# Patient Record
Sex: Female | Born: 1945 | Race: Black or African American | Hispanic: No | Marital: Single | Smoking: Former smoker
Health system: Southern US, Community
[De-identification: ages and names within clinical notes are randomized; demographics above are authoritative.]

## PROBLEM LIST (undated history)

## (undated) DIAGNOSIS — F0390 Unspecified dementia without behavioral disturbance: Secondary | ICD-10-CM

## (undated) DIAGNOSIS — K529 Noninfective gastroenteritis and colitis, unspecified: Secondary | ICD-10-CM

## (undated) DIAGNOSIS — F329 Major depressive disorder, single episode, unspecified: Secondary | ICD-10-CM

## (undated) DIAGNOSIS — I10 Essential (primary) hypertension: Secondary | ICD-10-CM

## (undated) DIAGNOSIS — N390 Urinary tract infection, site not specified: Secondary | ICD-10-CM

## (undated) DIAGNOSIS — F32A Depression, unspecified: Secondary | ICD-10-CM

## (undated) DIAGNOSIS — J189 Pneumonia, unspecified organism: Secondary | ICD-10-CM

## (undated) HISTORY — PX: APPENDECTOMY: SHX54

## (undated) HISTORY — PX: TONSILLECTOMY: SUR1361

## (undated) HISTORY — PX: TOTAL HIP ARTHROPLASTY: SHX124

---

## 2001-08-28 ENCOUNTER — Ambulatory Visit (HOSPITAL_COMMUNITY): Admission: RE | Admit: 2001-08-28 | Discharge: 2001-08-28 | Payer: Self-pay | Admitting: Internal Medicine

## 2001-08-28 ENCOUNTER — Encounter: Payer: Self-pay | Admitting: Internal Medicine

## 2001-09-19 ENCOUNTER — Ambulatory Visit (HOSPITAL_COMMUNITY): Admission: RE | Admit: 2001-09-19 | Discharge: 2001-09-19 | Payer: Self-pay | Admitting: Orthopaedic Surgery

## 2001-09-19 ENCOUNTER — Encounter: Payer: Self-pay | Admitting: Orthopaedic Surgery

## 2001-10-13 ENCOUNTER — Encounter: Payer: Self-pay | Admitting: Orthopaedic Surgery

## 2001-10-17 ENCOUNTER — Inpatient Hospital Stay (HOSPITAL_COMMUNITY): Admission: RE | Admit: 2001-10-17 | Discharge: 2001-10-21 | Payer: Self-pay | Admitting: Orthopaedic Surgery

## 2001-10-17 ENCOUNTER — Encounter: Payer: Self-pay | Admitting: Orthopaedic Surgery

## 2002-01-21 ENCOUNTER — Encounter (HOSPITAL_COMMUNITY): Admission: RE | Admit: 2002-01-21 | Discharge: 2002-02-20 | Payer: Self-pay | Admitting: Orthopaedic Surgery

## 2002-10-16 ENCOUNTER — Encounter: Payer: Self-pay | Admitting: Urology

## 2002-10-16 ENCOUNTER — Ambulatory Visit (HOSPITAL_COMMUNITY): Admission: RE | Admit: 2002-10-16 | Discharge: 2002-10-16 | Payer: Self-pay | Admitting: Urology

## 2008-01-06 ENCOUNTER — Ambulatory Visit (HOSPITAL_COMMUNITY): Admission: RE | Admit: 2008-01-06 | Discharge: 2008-01-06 | Payer: Self-pay | Admitting: Family Medicine

## 2008-01-14 ENCOUNTER — Ambulatory Visit (HOSPITAL_COMMUNITY): Admission: RE | Admit: 2008-01-14 | Discharge: 2008-01-14 | Payer: Self-pay | Admitting: Family Medicine

## 2008-03-07 ENCOUNTER — Emergency Department (HOSPITAL_COMMUNITY): Admission: EM | Admit: 2008-03-07 | Discharge: 2008-03-07 | Payer: Self-pay | Admitting: Emergency Medicine

## 2009-11-16 ENCOUNTER — Emergency Department (HOSPITAL_COMMUNITY): Admission: EM | Admit: 2009-11-16 | Discharge: 2009-11-16 | Payer: Self-pay | Admitting: Emergency Medicine

## 2009-11-30 ENCOUNTER — Emergency Department (HOSPITAL_COMMUNITY): Admission: EM | Admit: 2009-11-30 | Discharge: 2009-11-30 | Payer: Self-pay | Admitting: Emergency Medicine

## 2010-01-16 ENCOUNTER — Emergency Department (HOSPITAL_COMMUNITY): Admission: EM | Admit: 2010-01-16 | Discharge: 2010-01-16 | Payer: Self-pay | Admitting: Emergency Medicine

## 2010-06-21 ENCOUNTER — Ambulatory Visit (HOSPITAL_COMMUNITY): Admission: RE | Admit: 2010-06-21 | Discharge: 2010-06-21 | Payer: Self-pay | Admitting: Internal Medicine

## 2011-03-23 ENCOUNTER — Emergency Department (HOSPITAL_COMMUNITY)
Admission: EM | Admit: 2011-03-23 | Discharge: 2011-03-23 | Disposition: A | Discharge status: Home / Self Care | Payer: Medicare Other | Attending: Emergency Medicine | Admitting: Emergency Medicine

## 2011-03-23 DIAGNOSIS — T783XXA Angioneurotic edema, initial encounter: Secondary | ICD-10-CM | POA: Insufficient documentation

## 2011-03-23 DIAGNOSIS — I10 Essential (primary) hypertension: Secondary | ICD-10-CM | POA: Insufficient documentation

## 2011-03-23 LAB — CBC
Hemoglobin: 12.6 g/dL (ref 12.0–15.0)
MCH: 29.3 pg (ref 26.0–34.0)
MCV: 86.7 fL (ref 78.0–100.0)
RBC: 4.3 MIL/uL (ref 3.87–5.11)
WBC: 12.5 10*3/uL — ABNORMAL HIGH (ref 4.0–10.5)

## 2011-03-23 LAB — BASIC METABOLIC PANEL
CO2: 24 mEq/L (ref 19–32)
Chloride: 108 mEq/L (ref 96–112)
Creatinine, Ser: 0.84 mg/dL (ref 0.4–1.2)
GFR calc Af Amer: >60 mL/min (ref >60)
Potassium: 3.6 mEq/L (ref 3.5–5.1)

## 2011-03-23 LAB — DIFFERENTIAL
Basophils Relative: 0 % (ref 0–1)
Lymphs Abs: 0.8 10*3/uL (ref 0.7–4.0)
Monocytes Relative: 4 % (ref 3–12)
Neutro Abs: 11.1 10*3/uL — ABNORMAL HIGH (ref 1.7–7.7)
Neutrophils Relative %: 89 % — ABNORMAL HIGH (ref 43–77)

## 2011-04-20 NOTE — Discharge Summary (Signed)
[facility]  Patient:    Amy Drake, Amy Drake Visit Number: [id number] MRN: [id number]          Service Type: SUR Location: 4W 0478 01 Attending Physician:  Patricia Nettle Dictated by:   Ralene Bathe, P.A.-C. Admit Date:  10/17/2001 Discharge Date: 10/21/2001                             Discharge Summary  ADMITTING DIAGNOSES: 1. End-stage osteoarthritis. 2. Avascular necrosis, right hip.  DISCHARGE DIAGNOSES: 1. End-stage osteoarthritis. 2. Avascular necrosis, right hip. 3. Status post right total hip arthroplasty. 4. Postoperative hemorrhagic anemia requiring transfusion without sequela.  CONSULTS:  None.  OPERATION:  Right total hip arthroplasty.  SURGEON:  Patricia Nettle, M.D.  ASSISTANT:  Colleen P. Mahar, P.A.-C.  ANESTHESIA:  General.  BRIEF HISTORY:  Amy Drake is a 65 year old female, who had avascular necrosis in the right hip with progressive and dysfunctional osteoarthritic changes.  She has progressed now to bone-on-bone deformity.  She is having significant pain, effecting her activities of daily living.  She wishes to proceed with total hip replacement arthroplasty at this time.  Risks and benefits discussed with patient.  She is in agreement with procedure.  HOSPITAL COURSE:  The patient admitted and underwent above-mentioned procedure and tolerated this well.  All appropriate IV antibiotics and analgesics were utilized.  Postoperatively, she was placed total hip precautions, weightbearing as tolerated right lower extremity.  She had Hemovac that was discontinued after 72 hours.  She did have postoperative hemorrhagic anemia, was transfused two units without sequela.  She had good result.  She had minimal pain postoperatively and did extremely well with physical therapy.  By postoperative day four, 10/21/01, she was ambulating distances over 100 feet tolerating her total hip precautions.  She had had a bowel movement.  She  was afebrile.  Her dressing and incision were clean and dry.  At this time, she was stable orthopedically for discharge to home.  The patient was on Coumadin for DVT and PE prophylaxis, monitored by pharmacy.  This will be continued for four weeks.  LABORATORY DATA:  Protimes monitored by pharmacy.  Admission hemoglobin 10.5, postoperatively down to 8.3 after transfusion stable to 11.2.  Chemistry on admission within normal limits.  Urinalysis showed few bacteria on admission. No culture.  Four units transfused in the chart, two intraoperatively.  EKG showed normal sinus rhythm.  Radiology shows no acute cardiopulmonary on preop 10/13/01, and osteoarthritis aseptic necrosis on the right hip on 10/13/01.  CONDITION ON DISCHARGE:  Stable and improved.  DISCHARGE MEDICATIONS: 1. Vicodin #30, 1-2 every 4-6 hours p.r.n. pain, 1 refill. 2. Robaxin 500 mg 1 every 8 hours p.r.n. spasm, 1 refill. 3. Coumadin per pharmacy. 4. Resume home medications.  DISCHARGE PLANS:  The patient being discharged to home.  She is total hip precautions.  Weightbearing as tolerated right lower extremity.  May shower at this time.  Follow-up two weeks postoperatively, call for time.  Call for any problems.  DIET:  Resume regular diet. Dictated by:   Ralene Bathe, P.A.-C. Attending Physician:  Patricia Nettle. DD:  10/21/01 TD:  10/21/01 Job: [id number]

## 2011-04-20 NOTE — Op Note (Signed)
[facility]  Patient:    Amy Drake, Amy Drake Visit Number: [id number] MRN: [id number]          Service Type: SUR Location: 4W 0478 01 Attending Physician:  Patricia Nettle Proc. Date: 10/17/01 Admit Date:  10/17/2001                             Operative Report  DIAGNOSIS:  Osteoarthritis right hip.  PROCEDURE:  Right total hip arthroplasty, uncemented, using DePuy Corail stem and Pinnacle cup  SURGEON:  Patricia Nettle, M.D.  ASSISTANT:  Colleen P. Mahar, P.A.-C.  COMPLICATIONS:  None.  DESCRIPTION OF PROCEDURE:  The patient was properly identified in the holding area as Amy Drake and taken to the operating room.  She underwent  general endotracheal anesthesia without difficulty.  She was placed into a lateral decubitus position right side up.  Care was taken to pad all bony prominences. She was given prophylactic IV antibiotics.  The hip was prepped and draped in the usual sterile fashion.  A 10 cm incision was utilized, taking the standard posterolateral approach to the hip.  The skin incision was carried down to the fascia lata,  The fascia lata was incised in line with the incision.  The gluteus maximus muscle was split in line with its fibers.  A Charnley retractor was placed.  The trochanteric bursa was excised.  The short external rotators were identified.  The rotators were taken off of their insertion to the femur.  The quadratus femoris was preserved.  A capsulotomy was performed. The hip was dislocated.  There was a very large overhanging posterosuperior osteophyte.  This was resected with an osteotome.  The femoral head was removed using a saw.  The cut was made at 1.5 cm above the lesser trochanter. At this point, attention was directed to the acetabulum.  The acetabular labrum was resected.  Acetabular reamer starting with a 43 and increasing in 2 mm increments were utilized up to 51.  At this point, a trial acetabular cup was  placed, confirming a good rim fit.  There was copious bleeding bone.  A 52 mm Pinnacle cup was then impacted into position with  45 degrees of horizontality and 15 degrees of anteversion.  A 25 mm cancellous bone screw was placed through the superior hole in the acetabular cuff.  A trial +4, 10 degree offset acetabular liner was placed.  At this point, attention was directed to the femur.  Successfully larger broachers were up to a size 13.  A calcar reamer was used to prepare the neck surface.  A +1, 32 mm trial femoral head was placed, and the hip was reduced.  There was excellent stability with 80 degrees of internal rotation with the hip in 90 degrees of flexion.  At this point, the trial acetabular liner was removed.  The +4, 10 degree offset Marathon acetabular liner was then packed into position.  Attention was directed back to the femur.  The broach was removed.  A size 13 Corail HA-coated stem was then impacted into position.  A +1, 32 mm femoral head was then placed.  The hip was reduced and again found to be extremely stable.  the leg lengths were equal.  She was lengthened approximately 3 cm.  The wound was copiously irrigated.  The deep fascia was closed using #1 interrupted Vicryl suture.  A drain was placed.  The subcutaneous layer was closed using  2-0 interrupted Vicryl sutures.  Skin staples were used to close the skin.  A sterile dressing was applied.  A knee immobilizer was placed.  The patient was extubated without difficulty and transferred to the recovery room in stable condition. Attending Physician:  Patricia Nettle. DD:  10/19/01 TD:  10/19/01 Job: [id number]

## 2011-04-20 NOTE — H&P (Signed)
[facility]  Patient:    Amy Drake, Amy Drake Visit Number: [id number] MRN: [id number]          Service Type: Attending:  Patricia Nettle, M.D. Dictated by:   Sammuel Cooper. Mahar, P.A. Adm. Date:  10/17/01                           History and Physical  DATE OF BIRTH:  [birth year].  IDENTIFICATION:  She is a 65 year old female.  CHIEF COMPLAINT:  Right hip pain.  HISTORY OF PRESENT ILLNESS:  Patient has a long history of pain and problems with her right hip dating back to approximately 1996.  She did not have a specific traumatic event at that time; she had a gradual onset of hip pain and disability.  She denies any history of steroid use or lupus or alcoholism. Her hip pain has been progressively getting worse.  She does not use any assistive devices for ambulation but does walk with a limp.  On September 23rd, she developed right posterior thigh pain.  Her pain is increased with standing and walking, occasionally with a lot of activity.  Her toes on her right foot will go numb.  She does not describe any significant weakness in the right lower extremity.  The pain has gotten to the point where it is constant in nature and it is severely affecting her activities of daily living and quality of life.  It was thought that her best course of management at this point would be to undergo a total hip replacement on the right side. Risks and benefits were discussed with the patient by Dr. Patricia Nettle and they decided to proceed.  ALLERGIES:  No known drug allergies.  MEDICATIONS:  Vicodin as needed for pain, Tylenol as needed for pain.  PAST MEDICAL HISTORY:  Patient is healthy.  PAST SURGICAL HISTORY:  Tonsillectomy in 1950, appendectomy in 1961 and ectopic pregnancy in 1982.  SOCIAL HISTORY:  She smokes one-half a pack of cigarettes per day.  She uses alcohol very rarely, only a few times per year.  She is single.  Her mother, who is 73 years old, lives  with her in her private residence in [place]. She does have an aunt and sister who will be available to help her after surgery.  She would like to go home with home health physical therapy rather than inpatient rehab, if that is a possibility for her.  FAMILY MEDICAL HISTORY:  Mother is alive at age 46 with diabetes.  Father is deceased at age 97 with hypertension.  REVIEW OF SYSTEMS:  Patient denies any fevers, chills, night sweats or bleeding tendencies.  CNS:  Denies blurred vision, double vision, headaches, seizure or paralysis.  PULMONARY:  Denies shortness of breath, productive cough or hemoptysis.  CARDIOVASCULAR:  Denies chest pain, angina, orthopnea or claudication.  GI:  Denies nausea, vomiting, constipation, diarrhea, melena or bloody stool.  GU:  Denies dysuria, hematuria or discharge.  MUSCULOSKELETAL: As per HPI.  PHYSICAL EXAMINATION:  VITAL SIGNS:  Blood pressure is 128/80.  Respirations are 16 and unlabored. Pulse is 76 and regular.  GENERAL APPEARANCE:  Patient is a 65 year old black female.  She is alert and oriented.  She is in no acute distress.  She is well-nourished, well-groomed. She is pleasant and cooperative to examination.  She appears her stated age.  HEENT:  Head is normocephalic, atraumatic.  Pupils are equal, round and reactive.  Nares are patent bilaterally.  Pharynx is clear.  No erythema or exudate.  NECK:  Soft and supple to palpation.  No lymphadenopathy noted.  No bruits. No thyromegaly.  CHEST:  Clear to auscultation bilaterally.  No rales, rhonchi, stridor, wheezes or friction rub.  BREASTS:  Not pertinent and not performed.  HEART:  S1 and S2.  Regular rate and rhythm.  No murmurs, gallops or rubs appreciated.  ABDOMEN:  Soft and supple to palpation.  Positive bowel sounds throughout. Nontender, nondistended.  No organomegaly noted.  GU:  Not pertinent and not performed.  EXTREMITIES:  There is a 2-cm leg length discrepancy,  with the right leg being shorter than the left.  Range of motion is decreased to the right hip.  She does have groin pain and posterior thigh pain.  Strength is 5/5 throughout bilateral lower extremities.  Negative straight leg raise bilaterally. Hypoactive reflexes.  Sensation is intact to light touch.  SKIN:  Intact without any lesions or rashes.  X-RAY FINDINGS:  X-ray shows a collapse of the right femoral head with end-stage arthritic changes, cysts and bone-on-bone contact.  IMPRESSION:  Avascular necrosis to the right hip.  PLAN:  Plan is to admit to [facility] on Friday, October 17, 2001, for a right total hip arthroplasty to be done by Dr. Sharolyn Douglas.  Patients general practitioner is Dr. Bernerd Limbo. Destefano out of [place].  Patient has contacted him to forward his medical clearance to [place]; we have not received this yet, however, it will be forwarded to the hospital upon our receipt of it. Dictated by:   Sammuel Cooper. Mahar, P.A. Attending:  Patricia Nettle, M.D. DD:  10/07/01 TD:  10/08/01 Job: [phone] [id number]

## 2012-02-03 ENCOUNTER — Encounter (HOSPITAL_COMMUNITY): Payer: Self-pay | Admitting: *Deleted

## 2012-02-03 ENCOUNTER — Emergency Department (HOSPITAL_COMMUNITY)
Admission: EM | Admit: 2012-02-03 | Discharge: 2012-02-03 | Disposition: A | Discharge status: Other Acute Inpatient Hospital | Payer: Medicare Other | Attending: Emergency Medicine | Admitting: Emergency Medicine

## 2012-02-03 DIAGNOSIS — F3289 Other specified depressive episodes: Secondary | ICD-10-CM | POA: Insufficient documentation

## 2012-02-03 DIAGNOSIS — X58XXXA Exposure to other specified factors, initial encounter: Secondary | ICD-10-CM | POA: Insufficient documentation

## 2012-02-03 DIAGNOSIS — F068 Other specified mental disorders due to known physiological condition: Secondary | ICD-10-CM | POA: Insufficient documentation

## 2012-02-03 DIAGNOSIS — F329 Major depressive disorder, single episode, unspecified: Secondary | ICD-10-CM | POA: Insufficient documentation

## 2012-02-03 DIAGNOSIS — R220 Localized swelling, mass and lump, head: Secondary | ICD-10-CM | POA: Insufficient documentation

## 2012-02-03 DIAGNOSIS — Z79899 Other long term (current) drug therapy: Secondary | ICD-10-CM | POA: Insufficient documentation

## 2012-02-03 DIAGNOSIS — T783XXA Angioneurotic edema, initial encounter: Secondary | ICD-10-CM | POA: Insufficient documentation

## 2012-02-03 DIAGNOSIS — R51 Headache: Secondary | ICD-10-CM | POA: Insufficient documentation

## 2012-02-03 HISTORY — DX: Major depressive disorder, single episode, unspecified: F32.9

## 2012-02-03 HISTORY — DX: Unspecified dementia, unspecified severity, without behavioral disturbance, psychotic disturbance, mood disturbance, and anxiety: F03.90

## 2012-02-03 HISTORY — DX: Depression, unspecified: F32.A

## 2012-02-03 MED ORDER — DIPHENHYDRAMINE HCL 50 MG/ML IJ SOLN
25.0000 mg | Freq: Once | INTRAMUSCULAR | Status: AC
  Administered 2012-02-03: 13:00:00 via INTRAVENOUS
  Filled 2012-02-03: qty 1

## 2012-02-03 MED ORDER — METHYLPREDNISOLONE SODIUM SUCC 125 MG IJ SOLR
125.0000 mg | Freq: Once | INTRAMUSCULAR | Status: AC
  Administered 2012-02-03: 125 mg via INTRAVENOUS
  Filled 2012-02-03: qty 2

## 2012-02-03 MED ORDER — FAMOTIDINE IN NACL 20-0.9 MG/50ML-% IV SOLN
20.0000 mg | Freq: Once | INTRAVENOUS | Status: AC
  Administered 2012-02-03: 20 mg via INTRAVENOUS
  Filled 2012-02-03: qty 50

## 2012-02-03 NOTE — ED Notes (Signed)
Pt from [facility] called out for sick call, pt with lip swelling, no distress noted and pt very alert and talking, denies SOB

## 2012-02-03 NOTE — ED Notes (Signed)
Pt with bottom lip swelling, denies any pain or SOB, alert and talking, pt with dementia and not able to state correct month or year, not able to state the President of [place] either

## 2012-02-03 NOTE — Discharge Instructions (Signed)
STOP THE MEDICINE LISINOPRIL. CONTACT HER DOCTOR TOMORROW FOR A DIFFERENT BLOOD PRESSURE MEDICINE.    Angioedema Angioedema (AE) is sudden puffiness (swelling) of the skin. It can happen to the face, genitals, and other body parts. You may be reacting to something you are sensitive to (allergic reaction). It may have been passed to you from your parents (hereditary), or it may develop by itself (acquired). You may also get red itchy patches of skin (hives). Attacks may be mild. Some attacks are life-threatening. Most often, the puffiness happens fast. It often gets better in 24 to 48 hours.  HOME CARE  Carry your emergency allergy medicines with you.   Wear a medical bracelet.   Avoid things that you know will cause this reaction (triggers).  GET HELP RIGHT AWAY IF:   You have trouble breathing.   You have trouble swallowing.   You pass out (faint).   You have another attack.   Your attacks happen more often or get worse.   AE was passed to you by your parents and you want to start having children.  MAKE SURE YOU:   Understand these instructions.   Will watch your condition.   Will get help right away if you are not doing well or get worse.  Document Released: 11/07/2009 Document Revised: 11/08/2011 Document Reviewed: 11/07/2009 [facility] Patient Information 2012 [place], [place].

## 2012-02-03 NOTE — ED Notes (Signed)
MD at bedside. 

## 2012-02-03 NOTE — ED Provider Notes (Signed)
History   This chart was scribed for [employer]. Colon Branch, MD by Clarita Crane. The patient was seen in room APA14/APA14. Patient's care was started at 1113.    CSN: [id number]  Arrival date & time 02/03/12  1113   First MD Initiated Contact with Patient 02/03/12 1218      Chief Complaint  Patient presents with  . Allergic Reaction    (Consider location/radiation/quality/duration/timing/severity/associated sxs/prior treatment) HPI Amy Drake is a 66 y.o. female who presents to the Emergency Department BIB EMS from [facility] facility complaining of constant moderate swelling diffusely to lower lip with red lesion to left of nose onset this morning and persistent since. Denies dysphagia, pruritus, rash, SOB. Patient is currently on Lisinopril. Patient with h/o Dementia, depression  PCP- Margo Aye  Past Medical History  Diagnosis Date  . Dementia   . Depression     Past Surgical History  Procedure Date  . Appendectomy     History reviewed. No pertinent family history.  History  Substance Use Topics  . Smoking status: Current Everyday Smoker    Types: Cigarettes  . Smokeless tobacco: Not on file  . Alcohol Use:   -Resident of [place]  OB History    Grav Para Term Preterm Abortions TAB SAB Ect Mult Living                  Review of Systems 10 Systems reviewed and are negative for acute change except as noted in the HPI.  Allergies  Review of patient's allergies indicates no known allergies.  Home Medications   Current Outpatient Rx  Name Route Sig Dispense Refill  . LISINOPRIL 20 MG PO TABS Oral Take 20 mg by mouth daily.      BP 130/78  Pulse 84  Temp(Src) 98.3 F (36.8 C) (Oral)  Resp 20  Ht 5\' 4"  (1.626 m)  Wt 145 lb (65.772 kg)  BMI 24.89 kg/m2  SpO2 100%  Physical Exam  Nursing note and vitals reviewed. Constitutional: She is oriented to person, place, and time. She appears well-developed and well-nourished. No distress.  HENT:  Head:  Normocephalic and atraumatic.       10mm round lesion to left side of face c/w cryotherapy.  Lower lip swelling noted.   Eyes: EOM are normal. Pupils are equal, round, and reactive to light.  Neck: Neck supple. No tracheal deviation present.  Cardiovascular: Normal rate and regular rhythm.  Exam reveals no gallop and no friction rub.   No murmur heard. Pulmonary/Chest: Effort normal. No respiratory distress. She has no wheezes. She has no rales.  Abdominal: Soft. She exhibits no distension.  Musculoskeletal: Normal range of motion. She exhibits no edema.  Neurological: She is alert and oriented to person, place, and time. No sensory deficit.  Skin: Skin is warm and dry.  Psychiatric: She has a normal mood and affect. Her behavior is normal.    ED Course  Procedures (including critical care time)  DIAGNOSTIC STUDIES: Oxygen Saturation is 10% on room air, normal by my interpretation.    COORDINATION OF CARE: 12:32PM- Patient informed of current plan for treatment and evaluation and agrees with plan at this time.  2:20PM- Patients lip swelling not improved at this time but is no worse either.    Labs Reviewed - No data to display No results found.   No diagnosis found.    MDM  Nursing home patient here with c/o of swelling to her lower lip. Patient is on ACE  inhibitor and swelling is consistent with angioedema. Given solumedrol, pepcid and benadryl. Observed in the ER. She has had PO fluids and a snack without difficulty. No further swelling noted. Nursing home contact and advised of diagnosis and necessary follow up . Marland KitchenPt stable in ED with no significant deterioration in condition.The patient appears reasonably screened and/or stabilized for discharge and I doubt any other medical condition or other [facility] requiring further screening, evaluation, or treatment in the ED at this time prior to discharge.  I personally performed the services described in this documentation, which was  scribed in my presence. The recorded information has been reviewed and considered.  MDM Reviewed: nursing note and vitals        Nicoletta Dress. Colon Branch, MD 02/03/12 1441

## 2012-02-15 ENCOUNTER — Emergency Department (HOSPITAL_COMMUNITY): Payer: Medicare Other

## 2012-02-15 ENCOUNTER — Inpatient Hospital Stay (HOSPITAL_COMMUNITY)
Admission: EM | Admit: 2012-02-15 | Discharge: 2012-02-18 | DRG: 391 | Disposition: A | Discharge status: Skilled Nursing Facility | Payer: Medicare Other | Attending: Internal Medicine | Admitting: Internal Medicine

## 2012-02-15 ENCOUNTER — Encounter (HOSPITAL_COMMUNITY): Payer: Self-pay | Admitting: *Deleted

## 2012-02-15 DIAGNOSIS — F329 Major depressive disorder, single episode, unspecified: Secondary | ICD-10-CM | POA: Diagnosis present

## 2012-02-15 DIAGNOSIS — J189 Pneumonia, unspecified organism: Secondary | ICD-10-CM | POA: Diagnosis present

## 2012-02-15 DIAGNOSIS — K529 Noninfective gastroenteritis and colitis, unspecified: Secondary | ICD-10-CM

## 2012-02-15 DIAGNOSIS — F0390 Unspecified dementia without behavioral disturbance: Secondary | ICD-10-CM | POA: Diagnosis present

## 2012-02-15 DIAGNOSIS — I10 Essential (primary) hypertension: Secondary | ICD-10-CM | POA: Diagnosis present

## 2012-02-15 DIAGNOSIS — N390 Urinary tract infection, site not specified: Secondary | ICD-10-CM | POA: Diagnosis present

## 2012-02-15 DIAGNOSIS — A088 Other specified intestinal infections: Principal | ICD-10-CM | POA: Diagnosis present

## 2012-02-15 DIAGNOSIS — E860 Dehydration: Secondary | ICD-10-CM | POA: Diagnosis present

## 2012-02-15 DIAGNOSIS — F3289 Other specified depressive episodes: Secondary | ICD-10-CM | POA: Diagnosis present

## 2012-02-15 DIAGNOSIS — Z79899 Other long term (current) drug therapy: Secondary | ICD-10-CM

## 2012-02-15 HISTORY — DX: Essential (primary) hypertension: I10

## 2012-02-15 LAB — CBC
HCT: 38.1 % (ref 36.0–46.0)
Hemoglobin: 12.5 g/dL (ref 12.0–15.0)
MCH: 28.6 pg (ref 26.0–34.0)
MCV: 87.2 fL (ref 78.0–100.0)
Platelets: 215 10*3/uL (ref 150–400)
RBC: 4.37 MIL/uL (ref 3.87–5.11)
WBC: 12.6 10*3/uL — ABNORMAL HIGH (ref 4.0–10.5)

## 2012-02-15 LAB — URINALYSIS, ROUTINE W REFLEX MICROSCOPIC
Glucose, UA: NEGATIVE mg/dL
Leukocytes, UA: NEGATIVE
pH: 5.5 (ref 5.0–8.0)

## 2012-02-15 LAB — COMPREHENSIVE METABOLIC PANEL
AST: 13 U/L (ref 0–37)
CO2: 26 mEq/L (ref 19–32)
Calcium: 9 mg/dL (ref 8.4–10.5)
Chloride: 98 mEq/L (ref 96–112)
Creatinine, Ser: 0.97 mg/dL (ref 0.50–1.10)
GFR calc Af Amer: 68 mL/min — ABNORMAL LOW (ref >90)
GFR calc non Af Amer: 59 mL/min — ABNORMAL LOW (ref >90)
Glucose, Bld: 155 mg/dL — ABNORMAL HIGH (ref 70–99)
Total Bilirubin: 0.5 mg/dL (ref 0.3–1.2)

## 2012-02-15 LAB — DIFFERENTIAL
Basophils Absolute: 0 10*3/uL (ref 0.0–0.1)
Basophils Relative: 0 % (ref 0–1)
Eosinophils Relative: 0 % (ref 0–5)
Lymphocytes Relative: 2 % — ABNORMAL LOW (ref 12–46)
Monocytes Absolute: 0.1 10*3/uL (ref 0.1–1.0)
Neutro Abs: 12.3 10*3/uL — ABNORMAL HIGH (ref 1.7–7.7)

## 2012-02-15 LAB — LACTIC ACID, PLASMA: Lactic Acid, Venous: 1.9 mmol/L (ref 0.5–2.2)

## 2012-02-15 MED ORDER — LEVOFLOXACIN IN D5W 750 MG/150ML IV SOLN
750.0000 mg | Freq: Once | INTRAVENOUS | Status: AC
  Administered 2012-02-15: 750 mg via INTRAVENOUS
  Filled 2012-02-15: qty 150

## 2012-02-15 MED ORDER — SODIUM CHLORIDE 0.9 % IV SOLN
INTRAVENOUS | Status: DC
  Administered 2012-02-15: 21:00:00 via INTRAVENOUS

## 2012-02-15 MED ORDER — SODIUM CHLORIDE 0.9 % IV BOLUS (SEPSIS)
500.0000 mL | Freq: Once | INTRAVENOUS | Status: AC
  Administered 2012-02-15: 500 mL via INTRAVENOUS

## 2012-02-15 MED ORDER — DEXTROSE 5 % IV SOLN
INTRAVENOUS | Status: AC
  Filled 2012-02-15: qty 1

## 2012-02-15 MED ORDER — VANCOMYCIN HCL IN DEXTROSE 1-5 GM/200ML-% IV SOLN
1000.0000 mg | Freq: Once | INTRAVENOUS | Status: AC
  Administered 2012-02-16: 1000 mg via INTRAVENOUS
  Filled 2012-02-15: qty 200

## 2012-02-15 MED ORDER — ACETAMINOPHEN 325 MG PO TABS
650.0000 mg | ORAL_TABLET | Freq: Once | ORAL | Status: AC
  Administered 2012-02-15: 650 mg via ORAL
  Filled 2012-02-15: qty 2

## 2012-02-15 MED ORDER — DEXTROSE 5 % IV SOLN
1.0000 g | Freq: Once | INTRAVENOUS | Status: AC
  Administered 2012-02-15: 1 g via INTRAVENOUS
  Filled 2012-02-15: qty 1

## 2012-02-15 MED ORDER — ACETAMINOPHEN 325 MG PO TABS
325.0000 mg | ORAL_TABLET | Freq: Once | ORAL | Status: AC
  Administered 2012-02-15: 325 mg via ORAL
  Filled 2012-02-15: qty 1

## 2012-02-15 NOTE — ED Notes (Signed)
Pt resting calmly w/ eyes closed. Rise & fall of the chest noted. Bed in low position, side rails up x2. NAD noted at this time.  

## 2012-02-15 NOTE — ED Notes (Signed)
Patient walked to the restroom and back to room with no assistance. 

## 2012-02-15 NOTE — H&P (Addendum)
Amy Drake is an 66 y.o. female.    PCP: [name], MD, MD   Chief Complaint: Nausea, vomiting, and diarrhea  HPI: This is a 66 year old, African American female, who lives in a skilled nursing facility, who was sent over here for nausea, vomiting, and diarrhea. It appears, patient has advanced dementia and is unable to provide any history whatsoever. She is very confused. Patient was also found to have fever.   Home Medications: Prior to Admission medications   Medication Sig Start Date End Date Taking? Authorizing Provider  amLODipine (NORVASC) 2.5 MG tablet Take 2.5 mg by mouth daily.   Yes Historical Provider, MD    Allergies: No Known Allergies  Past Medical History: Past Medical History  Diagnosis Date  . Dementia   . Depression   . Hypertension     Past Surgical History  Procedure Date  . Appendectomy   . Tonsillectomy   . Total hip arthroplasty     right    Social History: Unable to obtain due to dementia.  Family History: Unable to obtain due to her dementia.  Review of Systems -unable to obtain due to dementia  Physical Examination Blood pressure 92/55, pulse 91, temperature 98.9 F (37.2 C), temperature source Oral, resp. rate 20, height 5\' 4"  (1.626 m), weight 63.504 kg (140 lb), SpO2 98.00%.  General appearance: alert, distracted and no distress Head: Normocephalic, without obvious abnormality, atraumatic Eyes: conjunctivae/corneas clear. PERRL, EOM's intact.  Throat: lips, mucosa, and tongue normal; teeth and gums normal Neck: no adenopathy, no carotid bruit, no JVD, supple, symmetrical, trachea midline and thyroid not enlarged, symmetric, no tenderness/mass/nodules Resp: Decreased air entry at the bases Cardio: regular rate and rhythm, S1, S2 normal, no murmur, click, rub or gallop GI: soft, non-tender; bowel sounds normal; no masses,  no organomegaly Extremities: extremities normal, atraumatic, no cyanosis or edema Pulses: 2+ and  symmetric Skin: Skin color, texture, turgor normal. No rashes or lesions Lymph nodes: Cervical, supraclavicular, and axillary nodes normal. Neurologic: She is very confused, but moving all her extremities.  Laboratory Data: Results for orders placed during the hospital encounter of 02/15/12 (from the past 48 hour(s))  URINALYSIS, ROUTINE W REFLEX MICROSCOPIC     Status: Abnormal   Collection Time   02/15/12  8:20 PM      Component Value Range Comment   Color, Urine YELLOW  YELLOW     APPearance HAZY (*) CLEAR     Specific Gravity, Urine >1.030 (*) 1.005 - 1.030     pH 5.5  5.0 - 8.0     Glucose, UA NEGATIVE  NEGATIVE (mg/dL)    Hgb urine dipstick LARGE (*) NEGATIVE     Bilirubin Urine NEGATIVE  NEGATIVE     Ketones, ur NEGATIVE  NEGATIVE (mg/dL)    Protein, ur TRACE (*) NEGATIVE (mg/dL)    Urobilinogen, UA 0.2  0.0 - 1.0 (mg/dL)    Nitrite NEGATIVE  NEGATIVE     Leukocytes, UA NEGATIVE  NEGATIVE    URINE MICROSCOPIC-ADD ON     Status: Abnormal   Collection Time   02/15/12  8:20 PM      Component Value Range Comment   Squamous Epithelial / LPF RARE  RARE     WBC, UA 3-6  <3 (WBC/hpf)    RBC / HPF 7-10  <3 (RBC/hpf)    Bacteria, UA MANY (*) RARE    CBC     Status: Abnormal   Collection Time   02/15/12  8:59  PM      Component Value Range Comment   WBC 12.6 (*) 4.0 - 10.5 (K/uL)    RBC 4.37  3.87 - 5.11 (MIL/uL)    Hemoglobin 12.5  12.0 - 15.0 (g/dL)    HCT 16.1  09.6 - 04.5 (%)    MCV 87.2  78.0 - 100.0 (fL)    MCH 28.6  26.0 - 34.0 (pg)    MCHC 32.8  30.0 - 36.0 (g/dL)    RDW 40.9  81.1 - 91.4 (%)    Platelets 215  150 - 400 (K/uL)   COMPREHENSIVE METABOLIC PANEL     Status: Abnormal   Collection Time   02/15/12  8:59 PM      Component Value Range Comment   Sodium 132 (*) 135 - 145 (mEq/L)    Potassium 3.8  3.5 - 5.1 (mEq/L)    Chloride 98  96 - 112 (mEq/L)    CO2 26  19 - 32 (mEq/L)    Glucose, Bld 155 (*) 70 - 99 (mg/dL)    BUN 24 (*) 6 - 23 (mg/dL)    Creatinine,  Ser 7.82  0.50 - 1.10 (mg/dL)    Calcium 9.0  8.4 - 10.5 (mg/dL)    Total Protein 7.2  6.0 - 8.3 (g/dL)    Albumin 3.3 (*) 3.5 - 5.2 (g/dL)    AST 13  0 - 37 (U/L)    ALT 9  0 - 35 (U/L)    Alkaline Phosphatase 82  39 - 117 (U/L)    Total Bilirubin 0.5  0.3 - 1.2 (mg/dL)    GFR calc non Af Amer 59 (*) >90 (mL/min)    GFR calc Af Amer 68 (*) >90 (mL/min)   DIFFERENTIAL     Status: Abnormal   Collection Time   02/15/12  8:59 PM      Component Value Range Comment   Neutrophils Relative 97 (*) 43 - 77 (%)    Neutro Abs 12.3 (*) 1.7 - 7.7 (K/uL)    Lymphocytes Relative 2 (*) 12 - 46 (%)    Lymphs Abs 0.2 (*) 0.7 - 4.0 (K/uL)    Monocytes Relative 1 (*) 3 - 12 (%)    Monocytes Absolute 0.1  0.1 - 1.0 (K/uL)    Eosinophils Relative 0  0 - 5 (%)    Eosinophils Absolute 0.0  0.0 - 0.7 (K/uL)    Basophils Relative 0  0 - 1 (%)    Basophils Absolute 0.0  0.0 - 0.1 (K/uL)   LACTIC ACID, PLASMA     Status: Normal   Collection Time   02/15/12  8:59 PM      Component Value Range Comment   Lactic Acid, Venous 1.9  0.5 - 2.2 (mmol/L)   LIPASE, BLOOD     Status: Normal   Collection Time   02/15/12  8:59 PM      Component Value Range Comment   Lipase 51  11 - 59 (U/L)   PROCALCITONIN     Status: Normal   Collection Time   02/15/12  8:59 PM      Component Value Range Comment   Procalcitonin 0.67       Radiology Reports: Dg Chest 2 View  02/15/2012  *RADIOLOGY REPORT*  Clinical Data: Confusion; weakness and fever.  CHEST - 2 VIEW  Comparison: None.  Findings: The lungs are well-aerated.  Mild left basilar airspace opacity raises concern for mild pneumonia.  Chronically increased interstitial markings are  seen.  There is no evidence of pleural effusion or pneumothorax.  The heart is normal in size; the mediastinal contour is within normal limits.  No acute osseous abnormalities are seen.  IMPRESSION: Mild left basilar airspace opacity raises concern for mild pneumonia.  Original Report Authenticated  By: Tonia Ghent, M.D.    Assessment/Plan  Principal Problem:  *Acute gastroenteritis Active Problems:  HCAP (healthcare-associated pneumonia)  UTI (lower urinary tract infection)  Dementia  Dehydration   #1 acute gastroenteritis: She could have norovirus. She will be given symptomatic treatment. Contact precautions will be utilized. If she has recurrence of her fever, other testing may be considered.  #2 pneumonia is most likely healthcare associated versus aspiration. We'll utilize Zosyn and vancomycin for now. We'll also give her Levaquin. Swallow evaluation. Will be obtained.  #3 possible UTI: treat with antibiotics.  #4 dehydration: give her IV fluids. Monitor blood pressure closely and provide IV fluid boluses as needed.  #5 dementia: stable continue to monitor.  #6 history of hypertension. Due to borderline low blood pressures we will hold her amlodipine for now.  Patient is a full code. Based on paperwork provided to [place], it appears, that [employer] is her legal guardian.  DVT, prophylaxis be initiated.  Further management decisions will depend on results of further testing and patient's response to treatment.  [facility]  [facility] Pager [phone]  02/16/2012, 12:38 AM

## 2012-02-15 NOTE — ED Notes (Addendum)
Pt reports some diarrhea, denies vomiting. Pt w/ fever upon arrival. No active vomiting at this time.

## 2012-02-15 NOTE — ED Notes (Signed)
Pt from [facility]. Reported pt vomiting & diarrhea started 3 hours ago.

## 2012-02-15 NOTE — ED Provider Notes (Signed)
History     CSN: [id number]  Arrival date & time 02/15/12  1858   First MD Initiated Contact with Patient 02/15/12 1942      Chief Complaint  Patient presents with  . Diarrhea  . Emesis    Patient is a 66 y.o. female presenting with diarrhea and vomiting. The history is provided by the nursing home and the EMS personnel. History Limited By: hx dementia   Diarrhea  Emesis    Pt was seen at 2005.  Per EMS and NH report, c/o gradual onset and persistence of multiple intermittent episodes of N/V/D that began approx 3 hours PTA.  Pt with hx dementia, states she is "ok."  Denies CP, no abd pain.    Past Medical History  Diagnosis Date  . Dementia   . Depression   . Hypertension     Past Surgical History  Procedure Date  . Appendectomy   . Tonsillectomy   . Total hip arthroplasty     right    History  Substance Use Topics  . Smoking status: Current Everyday Smoker    Types: Cigarettes  . Smokeless tobacco: Not on file  . Alcohol Use: No    Review of Systems  Unable to perform ROS: Dementia    Allergies  Review of patient's allergies indicates no known allergies.  Home Medications   Current Outpatient Rx  Name Route Sig Dispense Refill  . AMLODIPINE BESYLATE 2.5 MG PO TABS Oral Take 2.5 mg by mouth daily.      BP 97/56  Pulse 96  Temp(Src) 100.5 F (38.1 C) (Oral)  Resp 20  Ht 5\' 4"  (1.626 m)  Wt 140 lb (63.504 kg)  BMI 24.03 kg/m2  SpO2 100%  Physical Exam 2010: Physical examination:  Nursing notes reviewed; Vital signs and O2 SAT reviewed;  Constitutional: Well developed, Well nourished, In no acute distress; Head:  Normocephalic, atraumatic; Eyes: EOMI, PERRL, No scleral icterus; ENMT: Mouth and pharynx normal, Mucous membranes dry; Neck: Supple, Full range of motion, No lymphadenopathy; Cardiovascular: Regular rate and rhythm, No murmur, rub, or gallop; Respiratory: Breath sounds clear & equal bilaterally, No rales, rhonchi, wheezes, or rub, Normal  respiratory effort/excursion; Chest: Nontender, Movement normal; Abdomen: Soft, Nontender, Nondistended, Normal bowel sounds; Genitourinary: No CVA tenderness; Extremities: Pulses normal, No tenderness, No edema, No calf edema or asymmetry.; Neuro: Awake, alert, confused re: time, place, events.  Speech clear, no facial droop. Major CN grossly intact.  Moves all ext on stretcher.; Skin: Color normal, Warm, Dry, no rash.    ED Course  Procedures   MDM  MDM Reviewed: nursing note and vitals Interpretation: labs and x-ray   Results for orders placed during the hospital encounter of 02/15/12  URINALYSIS, ROUTINE W REFLEX MICROSCOPIC      Component Value Range   Color, Urine YELLOW  YELLOW    APPearance HAZY (*) CLEAR    Specific Gravity, Urine >1.030 (*) 1.005 - 1.030    pH 5.5  5.0 - 8.0    Glucose, UA NEGATIVE  NEGATIVE (mg/dL)   Hgb urine dipstick LARGE (*) NEGATIVE    Bilirubin Urine NEGATIVE  NEGATIVE    Ketones, ur NEGATIVE  NEGATIVE (mg/dL)   Protein, ur TRACE (*) NEGATIVE (mg/dL)   Urobilinogen, UA 0.2  0.0 - 1.0 (mg/dL)   Nitrite NEGATIVE  NEGATIVE    Leukocytes, UA NEGATIVE  NEGATIVE   URINE MICROSCOPIC-ADD ON      Component Value Range   Squamous Epithelial / LPF  RARE  RARE    WBC, UA 3-6  <3 (WBC/hpf)   RBC / HPF 7-10  <3 (RBC/hpf)   Bacteria, UA MANY (*) RARE   CBC      Component Value Range   WBC 12.6 (*) 4.0 - 10.5 (K/uL)   RBC 4.37  3.87 - 5.11 (MIL/uL)   Hemoglobin 12.5  12.0 - 15.0 (g/dL)   HCT 78.2  95.6 - 21.3 (%)   MCV 87.2  78.0 - 100.0 (fL)   MCH 28.6  26.0 - 34.0 (pg)   MCHC 32.8  30.0 - 36.0 (g/dL)   RDW 08.6  57.8 - 46.9 (%)   Platelets 215  150 - 400 (K/uL)  COMPREHENSIVE METABOLIC PANEL      Component Value Range   Sodium 132 (*) 135 - 145 (mEq/L)   Potassium 3.8  3.5 - 5.1 (mEq/L)   Chloride 98  96 - 112 (mEq/L)   CO2 26  19 - 32 (mEq/L)   Glucose, Bld 155 (*) 70 - 99 (mg/dL)   BUN 24 (*) 6 - 23 (mg/dL)   Creatinine, Ser 6.29  0.50 - 1.10  (mg/dL)   Calcium 9.0  8.4 - 52.8 (mg/dL)   Total Protein 7.2  6.0 - 8.3 (g/dL)   Albumin 3.3 (*) 3.5 - 5.2 (g/dL)   AST 13  0 - 37 (U/L)   ALT 9  0 - 35 (U/L)   Alkaline Phosphatase 82  39 - 117 (U/L)   Total Bilirubin 0.5  0.3 - 1.2 (mg/dL)   GFR calc non Af Amer 59 (*) >90 (mL/min)   GFR calc Af Amer 68 (*) >90 (mL/min)  DIFFERENTIAL      Component Value Range   Neutrophils Relative 97 (*) 43 - 77 (%)   Neutro Abs 12.3 (*) 1.7 - 7.7 (K/uL)   Lymphocytes Relative 2 (*) 12 - 46 (%)   Lymphs Abs 0.2 (*) 0.7 - 4.0 (K/uL)   Monocytes Relative 1 (*) 3 - 12 (%)   Monocytes Absolute 0.1  0.1 - 1.0 (K/uL)   Eosinophils Relative 0  0 - 5 (%)   Eosinophils Absolute 0.0  0.0 - 0.7 (K/uL)   Basophils Relative 0  0 - 1 (%)   Basophils Absolute 0.0  0.0 - 0.1 (K/uL)  LACTIC ACID, PLASMA      Component Value Range   Lactic Acid, Venous 1.9  0.5 - 2.2 (mmol/L)  LIPASE, BLOOD      Component Value Range   Lipase 51  11 - 59 (U/L)  PROCALCITONIN      Component Value Range   Procalcitonin 0.67     Dg Chest 2 View 02/15/2012  *RADIOLOGY REPORT*  Clinical Data: Confusion; weakness and fever.  CHEST - 2 VIEW  Comparison: None.  Findings: The lungs are well-aerated.  Mild left basilar airspace opacity raises concern for mild pneumonia.  Chronically increased interstitial markings are seen.  There is no evidence of pleural effusion or pneumothorax.  The heart is normal in size; the mediastinal contour is within normal limits.  No acute osseous abnormalities are seen.  IMPRESSION: Mild left basilar airspace opacity raises concern for mild pneumonia.  Original Report Authenticated By: Tonia Ghent, M.D.    10:49 PM:  SBP dropped to low 90's from 120, will give IVF bolus.  Fever improved after APAP.  IV abx ordered for HCAP.  +UTI, UC pending.  T/C to [facility]. Rito Ehrlich, case discussed, including:  HPI, pertinent PM/SHx, VS/PE,  dx testing, ED course and treatment:  Agreeable to admit, requests to obtain  tele bed to team 2.          Laray Anger, DO 02/17/12 1506

## 2012-02-16 ENCOUNTER — Encounter (HOSPITAL_COMMUNITY): Payer: Self-pay | Admitting: Internal Medicine

## 2012-02-16 DIAGNOSIS — N390 Urinary tract infection, site not specified: Secondary | ICD-10-CM

## 2012-02-16 DIAGNOSIS — K529 Noninfective gastroenteritis and colitis, unspecified: Secondary | ICD-10-CM | POA: Diagnosis present

## 2012-02-16 DIAGNOSIS — J189 Pneumonia, unspecified organism: Secondary | ICD-10-CM | POA: Diagnosis present

## 2012-02-16 DIAGNOSIS — I10 Essential (primary) hypertension: Secondary | ICD-10-CM | POA: Diagnosis present

## 2012-02-16 DIAGNOSIS — E860 Dehydration: Secondary | ICD-10-CM | POA: Diagnosis present

## 2012-02-16 DIAGNOSIS — F0390 Unspecified dementia without behavioral disturbance: Secondary | ICD-10-CM | POA: Diagnosis present

## 2012-02-16 HISTORY — DX: Urinary tract infection, site not specified: N39.0

## 2012-02-16 HISTORY — DX: Pneumonia, unspecified organism: J18.9

## 2012-02-16 HISTORY — DX: Noninfective gastroenteritis and colitis, unspecified: K52.9

## 2012-02-16 LAB — CBC
MCH: 28.9 pg (ref 26.0–34.0)
MCHC: 32.7 g/dL (ref 30.0–36.0)
Platelets: 169 10*3/uL (ref 150–400)
RDW: 13.1 % (ref 11.5–15.5)

## 2012-02-16 LAB — COMPREHENSIVE METABOLIC PANEL
ALT: 8 U/L (ref 0–35)
AST: 15 U/L (ref 0–37)
CO2: 24 mEq/L (ref 19–32)
Chloride: 100 mEq/L (ref 96–112)
GFR calc non Af Amer: 56 mL/min — ABNORMAL LOW (ref >90)
Sodium: 131 mEq/L — ABNORMAL LOW (ref 135–145)
Total Bilirubin: 0.6 mg/dL (ref 0.3–1.2)

## 2012-02-16 LAB — MRSA PCR SCREENING: MRSA by PCR: NEGATIVE

## 2012-02-16 MED ORDER — LEVOFLOXACIN IN D5W 500 MG/100ML IV SOLN
INTRAVENOUS | Status: AC
  Filled 2012-02-16: qty 100

## 2012-02-16 MED ORDER — SODIUM CHLORIDE 0.9 % IV BOLUS (SEPSIS)
500.0000 mL | Freq: Once | INTRAVENOUS | Status: AC
  Administered 2012-02-16: 500 mL via INTRAVENOUS

## 2012-02-16 MED ORDER — SODIUM CHLORIDE 0.9 % IV SOLN
INTRAVENOUS | Status: DC
  Administered 2012-02-16 – 2012-02-17 (×3): via INTRAVENOUS

## 2012-02-16 MED ORDER — ACETAMINOPHEN 325 MG PO TABS: 650.0000 mg | ORAL_TABLET | Freq: Four times a day (QID) | ORAL | Status: DC | PRN

## 2012-02-16 MED ORDER — ALBUTEROL SULFATE (5 MG/ML) 0.5% IN NEBU: 2.5000 mg | INHALATION_SOLUTION | RESPIRATORY_TRACT | Status: DC | PRN

## 2012-02-16 MED ORDER — LEVOFLOXACIN IN D5W 500 MG/100ML IV SOLN
500.0000 mg | INTRAVENOUS | Status: DC
  Filled 2012-02-16 (×3): qty 100

## 2012-02-16 MED ORDER — ONDANSETRON HCL 4 MG/2ML IJ SOLN: 4.0000 mg | Freq: Four times a day (QID) | INTRAMUSCULAR | Status: DC | PRN

## 2012-02-16 MED ORDER — ACETAMINOPHEN 650 MG RE SUPP: 650.0000 mg | Freq: Four times a day (QID) | RECTAL | Status: DC | PRN

## 2012-02-16 MED ORDER — LEVOFLOXACIN IN D5W 750 MG/150ML IV SOLN
750.0000 mg | INTRAVENOUS | Status: DC
  Filled 2012-02-16: qty 150

## 2012-02-16 MED ORDER — VANCOMYCIN HCL 500 MG IV SOLR
500.0000 mg | Freq: Two times a day (BID) | INTRAVENOUS | Status: DC
  Filled 2012-02-16 (×5): qty 500

## 2012-02-16 MED ORDER — ONDANSETRON HCL 4 MG PO TABS: 4.0000 mg | ORAL_TABLET | Freq: Four times a day (QID) | ORAL | Status: DC | PRN

## 2012-02-16 MED ORDER — PIPERACILLIN-TAZOBACTAM 3.375 G IVPB
3.3750 g | Freq: Three times a day (TID) | INTRAVENOUS | Status: DC
  Administered 2012-02-16: 3.375 g via INTRAVENOUS
  Filled 2012-02-16 (×7): qty 50

## 2012-02-16 NOTE — Plan of Care (Signed)
Problem: Phase I Progression Outcomes Goal: Pain controlled with appropriate interventions Outcome: Completed/Met Date Met:  02/16/12 Pt is denying any pain.

## 2012-02-16 NOTE — Progress Notes (Signed)
Subjective: She denies any nausea or vomiting, denies any diarrhea  Objective: Vital signs in last 24 hours: Temp:  [98.9 F (37.2 C)-102.2 F (39 C)] 99.4 F (37.4 C) (03/16 0428) Pulse Rate:  [81-101] 81  (03/16 0428) Resp:  [20] 20  (03/16 0428) BP: (92-119)/(55-79) 110/73 mmHg (03/16 0428) SpO2:  [96 %-100 %] 99 % (03/16 0428) Weight:  [63.504 kg (140 lb)] 63.504 kg (140 lb) (03/15 1900) Weight change:  Last BM Date: 02/16/12  Intake/Output from previous day: 03/15 0701 - 03/16 0700 In: 900 [IV Piggyback:900] Out: -      Physical Exam: General: Alert, awake, in no acute distress. HEENT: No bruits, no goiter. Heart: Regular rate and rhythm, without murmurs, rubs, gallops. Lungs: Clear to auscultation bilaterally. Abdomen: Soft, nontender, nondistended, positive bowel sounds. Extremities: No clubbing cyanosis or edema with positive pedal pulses. Neuro: Grossly intact, nonfocal.    Lab Results: Basic Metabolic Panel:  Basename 02/16/12 0732 02/15/12 2059  NA 131* 132*  K 3.5 3.8  CL 100 98  CO2 24 26  GLUCOSE 122* 155*  BUN 19 24*  CREATININE 1.01 0.97  CALCIUM 8.7 9.0  MG -- --  PHOS -- --   Liver Function Tests:  Basename 02/16/12 0732 02/15/12 2059  AST 15 13  ALT 8 9  ALKPHOS 72 82  BILITOT 0.6 0.5  PROT 6.8 7.2  ALBUMIN 3.0* 3.3*    Basename 02/15/12 2059  LIPASE 51  AMYLASE --   No results found for this basename: AMMONIA:2 in the last 72 hours CBC:  Basename 02/16/12 0732 02/15/12 2059  WBC 7.5 12.6*  NEUTROABS -- 12.3*  HGB 11.8* 12.5  HCT 36.1 38.1  MCV 88.3 87.2  PLT 169 215   Cardiac Enzymes: No results found for this basename: CKTOTAL:3,CKMB:3,CKMBINDEX:3,TROPONINI:3 in the last 72 hours BNP: No results found for this basename: PROBNP:3 in the last 72 hours D-Dimer: No results found for this basename: DDIMER:2 in the last 72 hours CBG: No results found for this basename: GLUCAP:6 in the last 72 hours Hemoglobin A1C: No  results found for this basename: HGBA1C in the last 72 hours Fasting Lipid Panel: No results found for this basename: CHOL,HDL,LDLCALC,TRIG,CHOLHDL,LDLDIRECT in the last 72 hours Thyroid Function Tests: No results found for this basename: TSH,T4TOTAL,FREET4,T3FREE,THYROIDAB in the last 72 hours Anemia Panel: No results found for this basename: VITAMINB12,FOLATE,FERRITIN,TIBC,IRON,RETICCTPCT in the last 72 hours Coagulation: No results found for this basename: LABPROT:2,INR:2 in the last 72 hours Urine Drug Screen: Drugs of Abuse  No results found for this basename: labopia, cocainscrnur, labbenz, amphetmu, thcu, labbarb    Alcohol Level: No results found for this basename: ETH:2 in the last 72 hours Urinalysis:  Basename 02/15/12 2020  COLORURINE YELLOW  LABSPEC >1.030*  PHURINE 5.5  GLUCOSEU NEGATIVE  HGBUR LARGE*  BILIRUBINUR NEGATIVE  KETONESUR NEGATIVE  PROTEINUR TRACE*  UROBILINOGEN 0.2  NITRITE NEGATIVE  LEUKOCYTESUR NEGATIVE   Recent Results (from the past 240 hour(s))  MRSA PCR SCREENING     Status: Normal   Collection Time   02/16/12  5:30 AM      Component Value Range Status Comment   MRSA by PCR NEGATIVE  NEGATIVE  Final     Studies/Results: Dg Chest 2 View  02/15/2012  *RADIOLOGY REPORT*  Clinical Data: Confusion; weakness and fever.  CHEST - 2 VIEW  Comparison: None.  Findings: The lungs are well-aerated.  Mild left basilar airspace opacity raises concern for mild pneumonia.  Chronically increased interstitial markings are  seen.  There is no evidence of pleural effusion or pneumothorax.  The heart is normal in size; the mediastinal contour is within normal limits.  No acute osseous abnormalities are seen.  IMPRESSION: Mild left basilar airspace opacity raises concern for mild pneumonia.  Original Report Authenticated By: Tonia Ghent, M.D.    Medications: Scheduled Meds:   . acetaminophen  325 mg Oral Once  . acetaminophen  650 mg Oral Once  . ceFEPime  (MAXIPIME) IV  1 g Intravenous Once  . levofloxacin (LEVAQUIN) IV  750 mg Intravenous Once  . levofloxacin (LEVAQUIN) IV  750 mg Intravenous Q48H  . piperacillin-tazobactam (ZOSYN)  IV  3.375 g Intravenous Q8H  . sodium chloride  500 mL Intravenous Once  . sodium chloride  500 mL Intravenous Once  . vancomycin  500 mg Intravenous Q12H  . vancomycin  1,000 mg Intravenous Once  . DISCONTD: levofloxacin (LEVAQUIN) IV  500 mg Intravenous Q24H   Continuous Infusions:   . sodium chloride 100 mL/hr at 02/16/12 0419  . DISCONTD: sodium chloride 100 mL/hr at 02/15/12 2103   PRN Meds:.acetaminophen, acetaminophen, albuterol, ondansetron (ZOFRAN) IV, ondansetron  Assessment/Plan:  Principal Problem:  *Acute gastroenteritis Active Problems:  HCAP (healthcare-associated pneumonia)  UTI (lower urinary tract infection)  Dementia  Dehydration  HTN (hypertension)  Plan:  Gastroenteritis appears to be improving, will advance diet an continue supportive care She does not have any significant resp compromise and she has remained afebrile since admission last night.  We will de escalate her antibiotics to levaquin.  Follow up cultures. Volume status appears to be improved with IVF  Likely discharge back to NH tomorrow if she is tolerating her diet and has no recurrence of fever.   LOS: 1 day   Demira Gwynne [facility] Pager: [phone] 02/16/2012, 10:54 AM

## 2012-02-16 NOTE — ED Notes (Signed)
Marsha at [facility] notified of the pt being admitted to the hospital

## 2012-02-16 NOTE — Progress Notes (Signed)
ANTIBIOTIC CONSULT NOTE - INITIAL  Pharmacy Consult for Vancomycin and Zosyn  Indication: pneumonia HCAP  No Known Allergies  Patient Measurements: Height: 5\' 4"  (162.6 cm) Weight: 140 lb (63.504 kg) IBW/kg (Calculated) : 54.7   Vital Signs: Temp: 99.4 F (37.4 C) (03/16 0428) Temp src: Oral (03/16 0428) BP: 110/73 mmHg (03/16 0428) Pulse Rate: 81  (03/16 0428) Intake/Output from previous day: 03/15 0701 - 03/16 0700 In: 900 [IV Piggyback:900] Out: -  Intake/Output from this shift:    Labs:  Basename 02/16/12 0732 02/15/12 2059  WBC 7.5 12.6*  HGB 11.8* 12.5  PLT 169 215  LABCREA -- --  CREATININE -- 0.97   Estimated Creatinine Clearance: 47.9 ml/min (by C-G formula based on Cr of 0.97). No results found for this basename: VANCOTROUGH:2,VANCOPEAK:2,VANCORANDOM:2,GENTTROUGH:2,GENTPEAK:2,GENTRANDOM:2,TOBRATROUGH:2,TOBRAPEAK:2,TOBRARND:2,AMIKACINPEAK:2,AMIKACINTROU:2,AMIKACIN:2, in the last 72 hours   Microbiology: Recent Results (from the past 720 hour(s))  MRSA PCR SCREENING     Status: Normal   Collection Time   02/16/12  5:30 AM      Component Value Range Status Comment   MRSA by PCR NEGATIVE  NEGATIVE  Final     Medical History: Past Medical History  Diagnosis Date  . Dementia   . Depression   . Hypertension     Medications:  Scheduled:    . acetaminophen  325 mg Oral Once  . acetaminophen  650 mg Oral Once  . ceFEPime (MAXIPIME) IV  1 g Intravenous Once  . levofloxacin (LEVAQUIN) IV  750 mg Intravenous Once  . levofloxacin (LEVAQUIN) IV  750 mg Intravenous Q48H  . piperacillin-tazobactam (ZOSYN)  IV  3.375 g Intravenous Q8H  . sodium chloride  500 mL Intravenous Once  . sodium chloride  500 mL Intravenous Once  . vancomycin  500 mg Intravenous Q12H  . vancomycin  1,000 mg Intravenous Once  . DISCONTD: levofloxacin (LEVAQUIN) IV  500 mg Intravenous Q24H   Assessment: Okay for Protocol Estimated Creatinine Clearance: 47.9 ml/min (by C-G formula  based on Cr of 0.97).  Goal of Therapy:  Vancomycin trough level 15-20 mcg/ml  Plan:  Measure antibiotic drug levels at steady state Follow up culture results Vancomycin 500mg  IV every 12 hours. Zosyn 3.375gm IV every 8 hours. Follow-up micro data, labs, vitals. Adjust Levaquin to 750mg  IV every 48 hours for renal function.  Lamonte Richer R 02/16/2012,8:28 AM

## 2012-02-17 LAB — URINE CULTURE
Colony Count: NO GROWTH
Culture  Setup Time: 201303162006
Culture: NO GROWTH

## 2012-02-17 LAB — BASIC METABOLIC PANEL
CO2: 21 mEq/L (ref 19–32)
Calcium: 8.3 mg/dL — ABNORMAL LOW (ref 8.4–10.5)
Creatinine, Ser: 0.96 mg/dL (ref 0.50–1.10)
GFR calc Af Amer: 69 mL/min — ABNORMAL LOW (ref >90)

## 2012-02-17 MED ORDER — LOPERAMIDE HCL 2 MG PO TABS: 2.0000 mg | ORAL_TABLET | Freq: Four times a day (QID) | ORAL | Status: AC | PRN

## 2012-02-17 MED ORDER — LEVOFLOXACIN 750 MG PO TABS: 750.0000 mg | ORAL_TABLET | Freq: Every day | ORAL | Status: AC

## 2012-02-17 MED ORDER — ONDANSETRON HCL 4 MG PO TABS: 4.0000 mg | ORAL_TABLET | Freq: Three times a day (TID) | ORAL | Status: AC | PRN

## 2012-02-17 NOTE — Progress Notes (Signed)
Spoke with [facility].  They cannot take pt back today because they have no way of getting her new rxs filled.  Will alert unit CSW for d/c tomorrow.

## 2012-02-17 NOTE — Plan of Care (Signed)
Problem: Phase II Progression Outcomes Goal: Progress activity as tolerated unless otherwise ordered Outcome: Progressing Pt ambulates with assistance to the bathroom.

## 2012-02-17 NOTE — Discharge Summary (Signed)
Physician Discharge Summary  Patient ID: Amy Drake MRN: [id number] DOB/AGE: 02/11/1946 66 y.o.  Admit date: 02/15/2012 Discharge date: 02/17/2012  Primary Care Physician:  [name], MD, MD   Discharge Diagnoses:    Principal Problem:  *Acute gastroenteritis Active Problems:  HCAP (healthcare-associated pneumonia)  UTI (lower urinary tract infection)  Dementia  Dehydration  HTN (hypertension)    Medication List  As of 02/17/2012 11:39 AM   STOP taking these medications         amLODipine 2.5 MG tablet         TAKE these medications         levofloxacin 750 MG tablet   Commonly known as: LEVAQUIN   Take 1 tablet (750 mg total) by mouth daily.      loperamide 2 MG tablet   Commonly known as: IMODIUM A-D   Take 1 tablet (2 mg total) by mouth 4 (four) times daily as needed for diarrhea or loose stools.      ondansetron 4 MG tablet   Commonly known as: ZOFRAN   Take 1 tablet (4 mg total) by mouth every 8 (eight) hours as needed for nausea.            Discharge Exam: No complaints. Blood pressure 107/71, pulse 70, temperature 98.8 F (37.1 C), temperature source Oral, resp. rate 20, height 5\' 4"  (1.626 m), weight 63.504 kg (140 lb), SpO2 96.00%. NAD CTA B S1, S2, RRR Soft, nt, bs+ No edema b/l  Disposition and Follow-up:  Follow up with MD at SNF  Consults:  none   Significant Diagnostic Studies:  Dg Chest 2 View  02/15/2012  *RADIOLOGY REPORT*  Clinical Data: Confusion; weakness and fever.  CHEST - 2 VIEW  Comparison: None.  Findings: The lungs are well-aerated.  Mild left basilar airspace opacity raises concern for mild pneumonia.  Chronically increased interstitial markings are seen.  There is no evidence of pleural effusion or pneumothorax.  The heart is normal in size; the mediastinal contour is within normal limits.  No acute osseous abnormalities are seen.  IMPRESSION: Mild left basilar airspace opacity raises concern for mild pneumonia.  Original  Report Authenticated By: Tonia Ghent, M.D.    Brief H and P: For complete details please refer to admission H and P, but in brief This is a 66 year old, African American female, who lives in a skilled nursing facility, who was sent over here for nausea, vomiting, and diarrhea. It appears, patient has advanced dementia and is unable to provide any history whatsoever. She is very confused. Patient was also found to have fever   Hospital Course:  This lady was admitted from a nursing home with vomiting and diarrhea.  She was treated for a viral gastroenteritis.  With conservative treatment, her symptoms have improved. Her diarrhea is minimal now and her vomiting has resolved.  She was adequately rehydrated with iv fluids.  Her chest xray on admission indicated a developing pneumonia.  She was started on empiric abx, which have since been de escalated to levaquin. She has remained afebrile, has no cough or shortness of breath and has a normal wbc count.  Her urinalysis was also indicative of infection.  Culture has been sent with final results pending. She is felt to be back to her baseline and is stable for transfer back to the nursing home today if possible.  Time spent on Discharge:  Signed: Synda Bagent [facility] Pager: [phone] 02/17/2012, 11:39 AM

## 2012-02-17 NOTE — Plan of Care (Signed)
Problem: Phase II Progression Outcomes Goal: IV changed to normal saline lock Outcome: Completed/Met Date Met:  02/17/12 Pt has no IV access at this time

## 2012-02-18 LAB — BASIC METABOLIC PANEL
Calcium: 8.8 mg/dL (ref 8.4–10.5)
GFR calc non Af Amer: 71 mL/min — ABNORMAL LOW (ref >90)
Glucose, Bld: 103 mg/dL — ABNORMAL HIGH (ref 70–99)
Sodium: 137 mEq/L (ref 135–145)

## 2012-02-18 NOTE — Progress Notes (Signed)
Patient for d/c back to ALF bed at [facility] today. Facility to provide transportation.  Reece Levy, MSW, Theresia Majors [phone]

## 2012-02-18 NOTE — Progress Notes (Signed)
Utilization review completed.  

## 2012-02-18 NOTE — Progress Notes (Signed)
Called report to Tresa Moore at [facility].

## 2012-02-18 NOTE — Progress Notes (Signed)
Patient transported out by RN. Discharge packet and paperwork given to facility driver. Patient in stable condition.

## 2012-02-18 NOTE — Evaluation (Signed)
Clinical/Bedside Swallow Evaluation Patient Details  Name: Amy Drake MRN: [id number] DOB: 02-02-1946 Today's Date: 02/18/2012  Past Medical History:  Past Medical History  Diagnosis Date  . Dementia   . Depression   . Hypertension    Past Surgical History:  Past Surgical History  Procedure Date  . Appendectomy   . Tonsillectomy   . Total hip arthroplasty     right   HPI:  Ms. Amy Drake is a 66 yo woman who was admitted to [facility] for nausea, vomiting, and diarrhea from [facility]. Upon admission, chest xray showed developing pna. She is now back to her presumed baseline and likely to discharge later today or tomorrow.   Assessment/Recommendations/Treatment Plan   SLP Assessment Clinical Impression Statement: No overt signs and symptoms of aspiration noted at bedside. Pt. denies chronic dysphagia, but does report that "every once in a while" she may have something "go down the wrong pipe". She appears pleasantly confused in that she states she lives at home alone (chart review states she lives at [facility]). Continue diet as ordered with standard aspiration and reflux precautions. Risk for Aspiration: None  Swallow Evaluation Recommendations Solid Consistency: Regular Liquid Consistency: Thin Liquid Administration via: Cup;Straw Medication Administration: Whole meds with liquid Supervision: Patient able to self feed Postural Changes and/or Swallow Maneuvers: Seated upright 90 degrees;Upright 30-60 min after meal Oral Care Recommendations: Oral care BID (with set up assist) Other Recommendations: Clarify dietary restrictions Follow up Recommendations: None  Treatment Plan Treatment Plan Recommendations: No treatment recommended at this time  Prognosis Prognosis for Safe Diet Advancement: Good  Individuals Consulted Consulted and Agree with Results and Recommendations: Patient;RN  Swallowing Goals   N/A  Swallow Study Prior Functional Status    Cognitive/Linguistic Baseline: Baseline deficits Baseline deficit details: Dementia Type of Home: Skilled Nursing Facility ([facility]) Receives Help From: Personal care attendant  General  Date of Onset: 02/16/12 HPI: Ms. Amy Drake is a 66 yo woman who was admitted to [facility] for nausea, vomiting, and diarrhea from [facility]. Upon admission, chest xray showed developing pna. She is now back to her presumed baseline and likely to discharge later today or tomorrow. Type of Study: Bedside swallow evaluation Diet Prior to this Study: Regular;Thin liquids Temperature Spikes Noted: Yes (currently down to 99) Respiratory Status: Room air History of Intubation: No Behavior/Cognition: Alert;Cooperative;Pleasant mood Oral Cavity - Dentition: Adequate natural dentition Vision: Functional for self-feeding Patient Positioning: Upright in bed Baseline Vocal Quality: Clear Volitional Cough: Strong Volitional Swallow: Able to elicit  Oral Motor/Sensory Function  Overall Oral Motor/Sensory Function: Appears within functional limits for tasks assessed  Consistency Results  Ice Chips Ice chips: Within functional limits Presentation: Spoon  Thin Liquid Thin Liquid: Within functional limits Presentation: Straw  Nectar Thick Liquid Nectar Thick Liquid: Not tested  Honey Thick Liquid Honey Thick Liquid: Not tested  Puree Puree: Within functional limits  Solid Solid: Within functional limits Presentation: Self Fed  Thank you, Amy Drake, CCC-SLP [phone]  Amy Drake 02/18/2012,10:42 AM

## 2012-02-18 NOTE — Progress Notes (Signed)
Patient was confused the early part of last evening and kept getting out of bed saying she needed to go upstairs.  She was continually reoriented and her bed alarm was in use.  She finally went to sleep and slept well during the night with no complaints.

## 2012-02-18 NOTE — Progress Notes (Signed)
Subjective: Pleasantly confused, denies any complaints  Objective: Vital signs in last 24 hours: Temp:  [98.7 F (37.1 C)-100.3 F (37.9 C)] 99 F (37.2 C) (03/18 0509) Pulse Rate:  [69-77] 75  (03/18 0509) Resp:  [20] 20  (03/18 0509) BP: (98-118)/(72-76) 108/73 mmHg (03/18 0509) SpO2:  [95 %-100 %] 100 % (03/18 0509) Weight change:  Last BM Date: 02/17/12  Intake/Output from previous day: 03/17 0701 - 03/18 0700 In: 720 [P.O.:720] Out: 6 [Urine:6]     Physical Exam: General: Alert, awake, in no acute distress. HEENT: No bruits, no goiter. Heart: Regular rate and rhythm, without murmurs, rubs, gallops. Lungs: Clear to auscultation bilaterally. Abdomen: Soft, nontender, nondistended, positive bowel sounds. Extremities: No clubbing cyanosis or edema with positive pedal pulses.     Lab Results: Basic Metabolic Panel:  Basename 02/18/12 0557 02/17/12 0630  NA 137 135  K 3.4* 3.2*  CL 107 107  CO2 21 21  GLUCOSE 103* 96  BUN 10 15  CREATININE 0.83 0.96  CALCIUM 8.8 8.3*  MG -- --  PHOS -- --   Liver Function Tests:  [place] 02/16/12 0732 02/15/12 2059  AST 15 13  ALT 8 9  ALKPHOS 72 82  BILITOT 0.6 0.5  PROT 6.8 7.2  ALBUMIN 3.0* 3.3*    Basename 02/15/12 2059  LIPASE 51  AMYLASE --   No results found for this basename: AMMONIA:2 in the last 72 hours CBC:  Basename 02/16/12 0732 02/15/12 2059  WBC 7.5 12.6*  NEUTROABS -- 12.3*  HGB 11.8* 12.5  HCT 36.1 38.1  MCV 88.3 87.2  PLT 169 215   Cardiac Enzymes: No results found for this basename: CKTOTAL:3,CKMB:3,CKMBINDEX:3,TROPONINI:3 in the last 72 hours BNP: No results found for this basename: PROBNP:3 in the last 72 hours D-Dimer: No results found for this basename: DDIMER:2 in the last 72 hours CBG: No results found for this basename: GLUCAP:6 in the last 72 hours Hemoglobin A1C: No results found for this basename: HGBA1C in the last 72 hours Fasting Lipid Panel: No results found for  this basename: CHOL,HDL,LDLCALC,TRIG,CHOLHDL,LDLDIRECT in the last 72 hours Thyroid Function Tests: No results found for this basename: TSH,T4TOTAL,FREET4,T3FREE,THYROIDAB in the last 72 hours Anemia Panel: No results found for this basename: VITAMINB12,FOLATE,FERRITIN,TIBC,IRON,RETICCTPCT in the last 72 hours Coagulation: No results found for this basename: LABPROT:2,INR:2 in the last 72 hours Urine Drug Screen: Drugs of Abuse  No results found for this basename: labopia, cocainscrnur, labbenz, amphetmu, thcu, labbarb    Alcohol Level: No results found for this basename: ETH:2 in the last 72 hours Urinalysis:  Basename 02/15/12 2020  COLORURINE YELLOW  LABSPEC >1.030*  PHURINE 5.5  GLUCOSEU NEGATIVE  HGBUR LARGE*  BILIRUBINUR NEGATIVE  KETONESUR NEGATIVE  PROTEINUR TRACE*  UROBILINOGEN 0.2  NITRITE NEGATIVE  LEUKOCYTESUR NEGATIVE    Recent Results (from the past 240 hour(s))  URINE CULTURE     Status: Normal   Collection Time   02/15/12  8:20 PM      Component Value Range Status Comment   Specimen Description URINE, CATHETERIZED   Final    Special Requests NONE   Final    Culture  Setup Time [id number]   Final    Colony Count NO GROWTH   Final    Culture NO GROWTH   Final    Report Status 02/17/2012 FINAL   Final   MRSA PCR SCREENING     Status: Normal   Collection Time   02/16/12  5:30 AM  Component Value Range Status Comment   MRSA by PCR NEGATIVE  NEGATIVE  Final     Studies/Results: No results found.  Medications: Scheduled Meds:   . levofloxacin (LEVAQUIN) IV  750 mg Intravenous Q48H   Continuous Infusions:   . sodium chloride 100 mL/hr at 02/17/12 0655   PRN Meds:.acetaminophen, acetaminophen, albuterol, ondansetron (ZOFRAN) IV, ondansetron  Assessment/Plan:  Principal Problem:  *Acute gastroenteritis Active Problems:  HCAP (healthcare-associated pneumonia)  UTI (lower urinary tract infection)  Dementia  Dehydration  HTN  (hypertension)  Plan:  Patient is stable discharge today Please refer to plan outlined in discharge summary done yesterday, no changes.   LOS: 3 days   Carmel Garfield [facility] Pager: [phone] 02/18/2012, 11:59 AM

## 2015-03-12 ENCOUNTER — Encounter (HOSPITAL_COMMUNITY): Payer: Self-pay | Admitting: Emergency Medicine

## 2015-03-12 ENCOUNTER — Emergency Department (HOSPITAL_COMMUNITY)
Admission: EM | Admit: 2015-03-12 | Discharge: 2015-03-12 | Disposition: A | Discharge status: Home / Self Care | Payer: Medicare Other | Attending: Emergency Medicine | Admitting: Emergency Medicine

## 2015-03-12 ENCOUNTER — Emergency Department (HOSPITAL_COMMUNITY): Payer: Medicare Other

## 2015-03-12 DIAGNOSIS — Z79899 Other long term (current) drug therapy: Secondary | ICD-10-CM | POA: Diagnosis not present

## 2015-03-12 DIAGNOSIS — M79604 Pain in right leg: Secondary | ICD-10-CM | POA: Insufficient documentation

## 2015-03-12 DIAGNOSIS — R52 Pain, unspecified: Secondary | ICD-10-CM

## 2015-03-12 DIAGNOSIS — Z720 Tobacco use: Secondary | ICD-10-CM | POA: Insufficient documentation

## 2015-03-12 DIAGNOSIS — F0390 Unspecified dementia without behavioral disturbance: Secondary | ICD-10-CM | POA: Insufficient documentation

## 2015-03-12 DIAGNOSIS — I10 Essential (primary) hypertension: Secondary | ICD-10-CM | POA: Diagnosis not present

## 2015-03-12 NOTE — ED Provider Notes (Signed)
CSN: [id number]     Arrival date & time 03/12/15  1000 History  This chart was scribed for Amy BerkshireJoseph Narada Uzzle, MD by Jarvis Morganaylor Ferguson, ED Scribe. This patient was seen in room APA09/APA09 and the patient's care was started at 10:14 AM.    Chief Complaint  Patient presents with  . Leg Pain   Level 5 Caveat-- Dementia  Patient is a 69 y.o. female presenting with leg pain. The history is provided by the nursing home and the patient. The history is limited by the condition of the patient (dementia). No language interpreter was used.  Leg Pain Location:  Leg Leg location:  R leg Pain details:    Quality:  Unable to specify   Radiates to:  Does not radiate   Severity:  Moderate   Timing:  Intermittent   Progression:  Unchanged Dislocation: no   Foreign body present:  No foreign bodies Tetanus status:  Unknown Prior injury to area:  Unable to specify Relieved by:  Rest Worsened by:  Bearing weight Associated symptoms: muscle weakness      HPI Comments: Amy Drake is a 69 y.o. female with a h/o dementia, depression and HTN who presents to the Emergency Department complaining of mild right leg pain that began yesterday. She was sent [facility]. The staff at the nursing home reports that the pt stumbled last night but did not fall. She is having associated weakness and difficulty ambulating due to pain. Per nursing home pt typically walks with a walker. The pain is exacerbated with ambulation and bearing weight. She notes no pain when lying down.    Past Medical History  Diagnosis Date  . Dementia   . Depression   . Hypertension    Past Surgical History  Procedure Laterality Date  . Appendectomy    . Tonsillectomy    . Total hip arthroplasty      right   Family History  Problem Relation Age of Onset  . Diabetes type II Mother   . Hypertension Father    History  Substance Use Topics  . Smoking status: Current Every Day Smoker -- 0.33 packs/day for 50 years     Types: Cigarettes  . Smokeless tobacco: Not on file  . Alcohol Use: No   OB History    No data available     Review of Systems  Unable to perform ROS: Dementia  Musculoskeletal: Positive for myalgias and gait problem.  Neurological: Positive for weakness.      Allergies  Review of patient's allergies indicates no known allergies.  Home Medications   Prior to Admission medications   Medication Sig Start Date End Date Taking? Authorizing Provider  acetaminophen (TYLENOL) 325 MG tablet Take 650 mg by mouth every 6 (six) hours as needed for mild pain.   Yes Historical Provider, MD  amLODipine (NORVASC) 2.5 MG tablet Take 2.5 mg by mouth daily.   Yes Historical Provider, MD  latanoprost (XALATAN) 0.005 % ophthalmic solution Place 1 drop into both eyes at bedtime.   Yes Historical Provider, MD   Triage Vitals: BP 145/97 mmHg  Pulse 66  Temp(Src) 97.6 F (36.4 C) (Oral)  Resp 18  Ht 5\' 5"  (1.651 m)  Wt 140 lb (63.504 kg)  BMI 23.30 kg/m2  SpO2 100%  Physical Exam  Constitutional: She appears well-developed.  HENT:  Head: Normocephalic.  Eyes: Conjunctivae and EOM are normal. No scleral icterus.  Neck: Neck supple. No thyromegaly present.  Cardiovascular: Normal rate and  regular rhythm.  Exam reveals no gallop and no friction rub.   No murmur heard. Pulmonary/Chest: No stridor. She has no wheezes. She has no rales. She exhibits no tenderness.  Abdominal: She exhibits no distension. There is no tenderness. There is no rebound.  Musculoskeletal: Normal range of motion. She exhibits no edema.  diffuclty walking due to pain and weakness in right leg  Lymphadenopathy:    She has no cervical adenopathy.  Neurological: She is alert. She exhibits normal muscle tone. Coordination normal.  Skin: No rash noted. No erythema.  Psychiatric: She has a normal mood and affect. Her behavior is normal.    ED Course  Procedures (including critical care time)  DIAGNOSTIC  STUDIES: Oxygen Saturation is 100% on RA, normal by my interpretation.    COORDINATION OF CARE: 10:38 AM- Will order imaging of right unilateral hip w/ pelvis 2-3 views and right ankle.  Pt advised of plan for treatment and pt agrees.     Labs Review Labs Reviewed - No data to display  Imaging Review Dg Ankle Complete Right  03/12/2015   CLINICAL DATA:  Right hip pain right ankle pain. Limited range of motion. Dementia.  EXAM: RIGHT ANKLE - COMPLETE 3+ VIEW  COMPARISON:  None.  FINDINGS: Ankle mortise intact. The talar dome is normal. No malleolar fracture. The calcaneus is normal.  IMPRESSION: No fracture or dislocation.   Electronically Signed   By: Genevive Bi M.D.   On: 03/12/2015 11:57   Dg Hip Unilat With Pelvis 2-3 Views Right  03/12/2015   CLINICAL DATA:  Post stumbled injury, now with right-sided hip pain.  EXAM: RIGHT HIP (WITH PELVIS) 2-3 VIEWS  COMPARISON:  None.  FINDINGS: Post right total hip replacement. No evidence of hardware failure or loosening. No fracture or dislocation. Several tiny ossicles are noted about the medial aspect of the right greater trochanter and are favored to be iatrogenic in etiology. No definite displaced fracture. Limited visualization of the adjacent pelvis is normal. Suspected mild degenerative change of the contralateral left hip, incompletely evaluated. Punctate calcifications overlying the sacrum are favored to represent degenerating uterine fibroids. Several phleboliths overlie the inferior aspect of the pubic symphysis. Moderate degenerative change of the lower lumbar spine is suspected though incompletely evaluated. No radiopaque foreign body.  IMPRESSION: 1. No acute findings. 2. Post right total hip replacement without evidence of complication.   Electronically Signed   By: Simonne Come M.D.   On: 03/12/2015 11:58     EKG Interpretation None      MDM   Final diagnoses:  Pain    Pt ambulating well on right leg,  All x-rays neg,   Possible soft tissue injury to right ankle.   Tylenol for pain and follow up as needed   The chart was scribed for me under my direct supervision.  I personally performed the history, physical, and medical decision making and all procedures in the evaluation of this patient.Amy Berkshire, MD 03/12/15 [phone]

## 2015-03-12 NOTE — ED Notes (Signed)
Pt sent from [facility].  Staff stated that pt stumbled but did not fall last night.  Pt denies any complaints.

## 2015-03-12 NOTE — Discharge Instructions (Signed)
Tylenol for pain.  Follow-up as needed 

## 2015-06-14 ENCOUNTER — Emergency Department (HOSPITAL_COMMUNITY): Payer: Medicare Other

## 2015-06-14 ENCOUNTER — Encounter (HOSPITAL_COMMUNITY): Payer: Self-pay | Admitting: Emergency Medicine

## 2015-06-14 ENCOUNTER — Emergency Department (HOSPITAL_COMMUNITY)
Admission: EM | Admit: 2015-06-14 | Discharge: 2015-06-14 | Disposition: A | Discharge status: Home / Self Care | Payer: Medicare Other | Attending: Emergency Medicine | Admitting: Emergency Medicine

## 2015-06-14 DIAGNOSIS — Z79899 Other long term (current) drug therapy: Secondary | ICD-10-CM | POA: Insufficient documentation

## 2015-06-14 DIAGNOSIS — Y92122 Bedroom in nursing home as the place of occurrence of the external cause: Secondary | ICD-10-CM | POA: Diagnosis not present

## 2015-06-14 DIAGNOSIS — Z87891 Personal history of nicotine dependence: Secondary | ICD-10-CM | POA: Insufficient documentation

## 2015-06-14 DIAGNOSIS — Y998 Other external cause status: Secondary | ICD-10-CM | POA: Insufficient documentation

## 2015-06-14 DIAGNOSIS — Z8659 Personal history of other mental and behavioral disorders: Secondary | ICD-10-CM | POA: Insufficient documentation

## 2015-06-14 DIAGNOSIS — W06XXXA Fall from bed, initial encounter: Secondary | ICD-10-CM | POA: Insufficient documentation

## 2015-06-14 DIAGNOSIS — Y9389 Activity, other specified: Secondary | ICD-10-CM | POA: Insufficient documentation

## 2015-06-14 DIAGNOSIS — Y92129 Unspecified place in nursing home as the place of occurrence of the external cause: Secondary | ICD-10-CM

## 2015-06-14 DIAGNOSIS — S0083XA Contusion of other part of head, initial encounter: Secondary | ICD-10-CM | POA: Diagnosis not present

## 2015-06-14 DIAGNOSIS — S0990XA Unspecified injury of head, initial encounter: Secondary | ICD-10-CM | POA: Diagnosis present

## 2015-06-14 DIAGNOSIS — F0390 Unspecified dementia without behavioral disturbance: Secondary | ICD-10-CM | POA: Insufficient documentation

## 2015-06-14 DIAGNOSIS — W19XXXA Unspecified fall, initial encounter: Secondary | ICD-10-CM

## 2015-06-14 DIAGNOSIS — I10 Essential (primary) hypertension: Secondary | ICD-10-CM | POA: Insufficient documentation

## 2015-06-14 DIAGNOSIS — S0093XA Contusion of unspecified part of head, initial encounter: Secondary | ICD-10-CM

## 2015-06-14 NOTE — ED Provider Notes (Signed)
CSN: [id number]     Arrival date & time 06/14/15  0450 History   First MD Initiated Contact with Patient 06/14/15 0453     Chief Complaint  Patient presents with  . Fall   Level V caveat for dementia  (Consider location/radiation/quality/duration/timing/severity/associated sxs/prior Treatment) HPI  Patient has a history of dementia presents from her nursing facility where she was found on the floor after a unwitnessed fall. She presents via EMS. Although patient is talking she doesn't always talk about what was asked of her, she is talking a lot about itching but cannot tell me if she's having any pain. She is noted to have some swelling on her right forehead.  PCP Dr Hughie Closs  Past Medical History  Diagnosis Date  . Dementia   . Depression   . Hypertension    Past Surgical History  Procedure Laterality Date  . Appendectomy    . Tonsillectomy    . Total hip arthroplasty      right   Family History  Problem Relation Age of Onset  . Diabetes type II Mother   . Hypertension Father    History  Substance Use Topics  . Smoking status: Former Smoker -- 0.33 packs/day for 50 years    Types: Cigarettes  . Smokeless tobacco: Not on file  . Alcohol Use: No   Patient lives in a nursing home Patient is a guardian of social services  OB History    No data available     Review of Systems  Unable to perform ROS: Dementia      Allergies  Review of patient's allergies indicates no known allergies.  Home Medications   Prior to Admission medications   Medication Sig Start Date End Date Taking? Authorizing Provider  acetaminophen (TYLENOL) 325 MG tablet Take 650 mg by mouth every 6 (six) hours as needed for mild pain.    Historical Provider, MD  amLODipine (NORVASC) 2.5 MG tablet Take 2.5 mg by mouth daily.    Historical Provider, MD  latanoprost (XALATAN) 0.005 % ophthalmic solution Place 1 drop into both eyes at bedtime.    Historical Provider, MD   BP 134/86 mmHg  Pulse  74  Temp(Src) 98.3 F (36.8 C) (Oral)  Resp 16  Wt 140 lb (63.504 kg)  SpO2 98%  Vital signs normal   Physical Exam  Constitutional: She appears well-developed and well-nourished.  Non-toxic appearance. She does not appear ill. No distress.  HENT:  Head: Normocephalic.  Right Ear: External ear normal.  Left Ear: External ear normal.  Nose: Nose normal. No mucosal edema or rhinorrhea.  Mouth/Throat: Oropharynx is clear and moist and mucous membranes are normal. No dental abscesses or uvula swelling.  Patient has a swollen area on her right forehead  Eyes: Conjunctivae and EOM are normal. Pupils are equal, round, and reactive to light.  Neck: Full passive range of motion without pain.  C-collar was placed by EMS  Cardiovascular: Normal rate, regular rhythm and normal heart sounds.  Exam reveals no gallop and no friction rub.   No murmur heard. Pulmonary/Chest: Effort normal and breath sounds normal. No respiratory distress. She has no wheezes. She has no rhonchi. She has no rales. She exhibits no tenderness and no crepitus.  Abdominal: Soft. Normal appearance and bowel sounds are normal. She exhibits no distension. There is no tenderness. There is no rebound and no guarding.  Musculoskeletal: Normal range of motion. She exhibits no edema or tenderness.  Moves all extremities well. I  can flex her knee and hip without apparent difficulty bilaterally. She is moving her arms freely without difficulty or apparent pain.  Neurological: She is alert. She has normal strength. No cranial nerve deficit.  Patient is talking but she doesn't always talk about what was asked of her, some of her words are incomprehensible.  Skin: Skin is warm, dry and intact. No rash noted. No erythema. No pallor.  Psychiatric: She has a normal mood and affect. Her speech is normal and behavior is normal. Her mood appears not anxious.  Nursing note and vitals reviewed.      ED Course  Procedures (including  critical care time) Pt remained awake and alert during her ED visit.    Labs Review Labs Reviewed - No data to display  Imaging Review Ct Head Wo Contrast Ct Cervical Spine Wo Contrast  06/14/2015   CLINICAL DATA:  Unwitnessed fall out of bed, struck head on dresser. Dementia.  EXAM: CT HEAD WITHOUT CONTRAST  CT CERVICAL SPINE WITHOUT CONTRAST  TECHNIQUE: Multidetector CT imaging of the head and cervical spine was performed following the standard protocol without intravenous contrast. Multiplanar CT image reconstructions of the cervical spine were also generated.  COMPARISON:  CT head 03/12/2015  FINDINGS: CT HEAD FINDINGS  Diffuse cerebral atrophy. Swell subcutaneous scalp hematoma over the right anterior frontal region. No mass effect or midline shift. No abnormal extra-axial fluid collections. Gray-white matter junctions are distinct. Basal cisterns are not effaced. No evidence of acute intracranial hemorrhage. No depressed skull fractures. Opacification of the left maxillary antrum and left ethmoid air cells. Mastoid air cells are not opacified.  CT CERVICAL SPINE FINDINGS  Increased of the usual cervical lordosis. No anterior subluxation. Normal alignment of facet joints. Degenerative changes in the cervical spine with mildly narrowed cervical interspaces and mild endplate hypertrophic changes. Degenerative changes in the facet joints. C1-2 articulation appears intact. 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 No vertebral compression deformities. No prevertebral soft tissue swelling. No focal bone lesion or bone destruction. Bone cortex and trabecular architecture appear intact. There are emphysematous changes in the upper lungs.  IMPRESSION: No acute intracranial abnormalities. Chronic atrophy. Opacification of left paranasal sinuses.  Increased cervical lordosis. Mild degenerative changes in the cervical spine. No acute displaced fractures identified.   Electronically Signed    By: Burman NievesWilliam  Stevens M.D.   On: 06/14/2015 05:55     EKG Interpretation None      MDM   Final diagnoses:  Fall at nursing home, initial encounter  Contusion of head, initial encounter    Plan discharge  Devoria AlbeIva Peter Daquila, MD, Concha PyoFACEP     Taleyah Hillman, MD 06/14/15 [phone]

## 2015-06-14 NOTE — ED Notes (Signed)
Pt had unwitnessed fall out of bed and hit head on dresser.

## 2015-06-14 NOTE — Discharge Instructions (Signed)
Ice packs to the swollen area if she will let you. The swollen area on her forehead is blood and you may see bruising around her right eye as she sitting up or ambulatory (gravity will make the blood flow distally). She should be rechecked for any problems listed on the head injury sheet.    Cryotherapy Cryotherapy is when you put ice on your injury. Ice helps lessen pain and puffiness (swelling) after an injury. Ice works the best when you start using it in the first 24 to 48 hours after an injury. HOME CARE  Put a dry or damp towel between the ice pack and your skin.  You may press gently on the ice pack.  Leave the ice on for no more than 10 to 20 minutes at a time.  Check your skin after 5 minutes to make sure your skin is okay.  Rest at least 20 minutes between ice pack uses.  Stop using ice when your skin loses feeling (numbness).  Do not use ice on someone who cannot tell you when it hurts. This includes small children and people with memory problems (dementia). GET HELP RIGHT AWAY IF:  You have white spots on your skin.  Your skin turns blue or pale.  Your skin feels waxy or hard.  Your puffiness gets worse. MAKE SURE YOU:   Understand these instructions.  Will watch your condition.  Will get help right away if you are not doing well or get worse. Document Released: 05/07/2008 Document Revised: 02/11/2012 Document Reviewed: 07/12/2011 [facility] Patient Information 2015 [place], [place]. This information is not intended to replace advice given to you by your health care provider. Make sure you discuss any questions you have with your health care provider.  Head Injury You have a head injury. Headaches and throwing up (vomiting) are common after a head injury. It should be easy to wake up from sleeping. Sometimes you must stay in the hospital. Most problems happen within the first 24 hours. Side effects may occur up to 7-10 days after the injury.  WHAT ARE THE TYPES OF  HEAD INJURIES? Head injuries can be as minor as a bump. Some head injuries can be more severe. More severe head injuries include:  A jarring injury to the brain (concussion).  A bruise of the brain (contusion). This mean there is bleeding in the brain that can cause swelling.  A cracked skull (skull fracture).  Bleeding in the brain that collects, clots, and forms a bump (hematoma). WHEN SHOULD I GET HELP RIGHT AWAY?   You are confused or sleepy.  You cannot be woken up.  You feel sick to your stomach (nauseous) or keep throwing up (vomiting).  Your dizziness or unsteadiness is getting worse.  You have very bad, lasting headaches that are not helped by medicine. Take medicines only as told by your doctor.  You cannot use your arms or legs like normal.  You cannot walk.  You notice changes in the black spots in the center of the colored part of your eye (pupil).  You have clear or bloody fluid coming from your nose or ears.  You have trouble seeing. During the next 24 hours after the injury, you must stay with someone who can watch you. This person should get help right away (call 911 in the [place] if you start to shake and are not able to control it (have seizures), you pass out, or you are unable to wake up. HOW CAN I PREVENT A  HEAD INJURY IN THE FUTURE?  Wear seat belts.  Wear a helmet while bike riding and playing sports like football.  Stay away from dangerous activities around the house. WHEN CAN I RETURN TO NORMAL ACTIVITIES AND ATHLETICS? See your doctor before doing these activities. You should not do normal activities or play contact sports until 1 week after the following symptoms have stopped:  Headache that does not go away.  Dizziness.  Poor attention.  Confusion.  Memory problems.  Sickness to your stomach or throwing up.  Tiredness.  Fussiness.  Bothered by bright lights or loud noises.  Anxiousness or depression.  Restless sleep. MAKE SURE  YOU:   Understand these instructions.  Will watch your condition.  Will get help right away if you are not doing well or get worse. Document Released: 11/01/2008 Document Revised: 04/05/2014 Document Reviewed: 07/27/2013 [facility] Patient Information 2015 [place], [place]. This information is not intended to replace advice given to you by your health care provider. Make sure you discuss any questions you have with your health care provider.

## 2015-06-14 NOTE — ED Notes (Signed)
C-collar removed per verbal order of Dr. Lynelle DoctorKnapp.

## 2016-01-06 ENCOUNTER — Encounter (HOSPITAL_COMMUNITY): Payer: Self-pay

## 2016-01-06 ENCOUNTER — Emergency Department (HOSPITAL_COMMUNITY): Payer: Medicare Other

## 2016-01-06 ENCOUNTER — Inpatient Hospital Stay (HOSPITAL_COMMUNITY): Payer: Medicare Other

## 2016-01-06 ENCOUNTER — Inpatient Hospital Stay (HOSPITAL_COMMUNITY)
Admission: EM | Admit: 2016-01-06 | Discharge: 2016-01-09 | DRG: 481 | Disposition: A | Discharge status: Skilled Nursing Facility | Payer: Medicare Other | Attending: Internal Medicine | Admitting: Internal Medicine

## 2016-01-06 DIAGNOSIS — Z6821 Body mass index (BMI) 21.0-21.9, adult: Secondary | ICD-10-CM | POA: Diagnosis not present

## 2016-01-06 DIAGNOSIS — E46 Unspecified protein-calorie malnutrition: Secondary | ICD-10-CM | POA: Diagnosis present

## 2016-01-06 DIAGNOSIS — T1490XA Injury, unspecified, initial encounter: Secondary | ICD-10-CM

## 2016-01-06 DIAGNOSIS — S72002A Fracture of unspecified part of neck of left femur, initial encounter for closed fracture: Secondary | ICD-10-CM | POA: Diagnosis present

## 2016-01-06 DIAGNOSIS — Z833 Family history of diabetes mellitus: Secondary | ICD-10-CM | POA: Diagnosis not present

## 2016-01-06 DIAGNOSIS — Z96641 Presence of right artificial hip joint: Secondary | ICD-10-CM | POA: Diagnosis present

## 2016-01-06 DIAGNOSIS — Z87891 Personal history of nicotine dependence: Secondary | ICD-10-CM | POA: Diagnosis not present

## 2016-01-06 DIAGNOSIS — Y92129 Unspecified place in nursing home as the place of occurrence of the external cause: Secondary | ICD-10-CM | POA: Diagnosis not present

## 2016-01-06 DIAGNOSIS — F0390 Unspecified dementia without behavioral disturbance: Secondary | ICD-10-CM | POA: Diagnosis present

## 2016-01-06 DIAGNOSIS — Z8249 Family history of ischemic heart disease and other diseases of the circulatory system: Secondary | ICD-10-CM | POA: Diagnosis not present

## 2016-01-06 DIAGNOSIS — Z79899 Other long term (current) drug therapy: Secondary | ICD-10-CM

## 2016-01-06 DIAGNOSIS — W19XXXA Unspecified fall, initial encounter: Secondary | ICD-10-CM

## 2016-01-06 DIAGNOSIS — S72002D Fracture of unspecified part of neck of left femur, subsequent encounter for closed fracture with routine healing: Secondary | ICD-10-CM | POA: Diagnosis not present

## 2016-01-06 DIAGNOSIS — W19XXXD Unspecified fall, subsequent encounter: Secondary | ICD-10-CM | POA: Diagnosis not present

## 2016-01-06 DIAGNOSIS — M25552 Pain in left hip: Secondary | ICD-10-CM | POA: Diagnosis present

## 2016-01-06 DIAGNOSIS — W010XXA Fall on same level from slipping, tripping and stumbling without subsequent striking against object, initial encounter: Secondary | ICD-10-CM | POA: Diagnosis present

## 2016-01-06 DIAGNOSIS — I10 Essential (primary) hypertension: Secondary | ICD-10-CM | POA: Diagnosis present

## 2016-01-06 HISTORY — DX: Pneumonia, unspecified organism: J18.9

## 2016-01-06 HISTORY — DX: Urinary tract infection, site not specified: N39.0

## 2016-01-06 HISTORY — DX: Noninfective gastroenteritis and colitis, unspecified: K52.9

## 2016-01-06 LAB — CBC WITH DIFFERENTIAL/PLATELET
BASOS ABS: 0 10*3/uL (ref 0.0–0.1)
Basophils Relative: 0 %
EOS PCT: 0 %
Eosinophils Absolute: 0 10*3/uL (ref 0.0–0.7)
HCT: 42.7 % (ref 36.0–46.0)
Hemoglobin: 13.8 g/dL (ref 12.0–15.0)
LYMPHS ABS: 0.6 10*3/uL — AB (ref 0.7–4.0)
LYMPHS PCT: 5 %
MCH: 28.9 pg (ref 26.0–34.0)
MCHC: 32.3 g/dL (ref 30.0–36.0)
MCV: 89.3 fL (ref 78.0–100.0)
MONO ABS: 0.5 10*3/uL (ref 0.1–1.0)
MONOS PCT: 4 %
Neutro Abs: 10.7 10*3/uL — ABNORMAL HIGH (ref 1.7–7.7)
Neutrophils Relative %: 91 %
PLATELETS: 226 10*3/uL (ref 150–400)
RBC: 4.78 MIL/uL (ref 3.87–5.11)
RDW: 13 % (ref 11.5–15.5)
WBC: 11.8 10*3/uL — ABNORMAL HIGH (ref 4.0–10.5)

## 2016-01-06 LAB — BASIC METABOLIC PANEL
Anion gap: 6 (ref 5–15)
BUN: 18 mg/dL (ref 6–20)
CALCIUM: 9.5 mg/dL (ref 8.9–10.3)
CO2: 28 mmol/L (ref 22–32)
CREATININE: 0.87 mg/dL (ref 0.44–1.00)
Chloride: 108 mmol/L (ref 101–111)
GFR calc Af Amer: >60 mL/min (ref >60)
GLUCOSE: 133 mg/dL — AB (ref 65–99)
Potassium: 4.7 mmol/L (ref 3.5–5.1)
Sodium: 142 mmol/L (ref 135–145)

## 2016-01-06 LAB — PREPARE RBC (CROSSMATCH)

## 2016-01-06 LAB — ABO/RH: ABO/RH(D): O POS

## 2016-01-06 MED ORDER — MORPHINE SULFATE (PF) 4 MG/ML IV SOLN
4.0000 mg | Freq: Once | INTRAVENOUS | Status: AC
  Administered 2016-01-06: 4 mg via INTRAVENOUS
  Filled 2016-01-06: qty 1

## 2016-01-06 MED ORDER — DOCUSATE SODIUM 100 MG PO CAPS
100.0000 mg | ORAL_CAPSULE | Freq: Two times a day (BID) | ORAL | Status: DC
  Administered 2016-01-06 – 2016-01-09 (×6): 100 mg via ORAL
  Filled 2016-01-06 (×7): qty 1

## 2016-01-06 MED ORDER — SODIUM CHLORIDE 0.9 % IV SOLN
INTRAVENOUS | Status: DC
  Administered 2016-01-06: 16:00:00 via INTRAVENOUS

## 2016-01-06 MED ORDER — AMLODIPINE BESYLATE 5 MG PO TABS
2.5000 mg | ORAL_TABLET | Freq: Every day | ORAL | Status: DC
Start: 2016-01-07 — End: 2016-01-09
  Administered 2016-01-07 – 2016-01-09 (×3): 2.5 mg via ORAL
  Filled 2016-01-06 (×4): qty 1

## 2016-01-06 MED ORDER — MORPHINE SULFATE (PF) 2 MG/ML IV SOLN: 0.5000 mg | INTRAVENOUS | Status: DC | PRN

## 2016-01-06 MED ORDER — HEPARIN SODIUM (PORCINE) 5000 UNIT/ML IJ SOLN
5000.0000 [IU] | Freq: Three times a day (TID) | INTRAMUSCULAR | Status: DC
  Administered 2016-01-06: 5000 [IU] via SUBCUTANEOUS
  Filled 2016-01-06: qty 1

## 2016-01-06 MED ORDER — ONDANSETRON HCL 4 MG/2ML IJ SOLN: 4.0000 mg | Freq: Three times a day (TID) | INTRAMUSCULAR | Status: AC | PRN

## 2016-01-06 MED ORDER — MORPHINE SULFATE (PF) 2 MG/ML IV SOLN
1.0000 mg | INTRAVENOUS | Status: DC | PRN
  Administered 2016-01-06 – 2016-01-07 (×4): 1 mg via INTRAVENOUS
  Filled 2016-01-06 (×4): qty 1

## 2016-01-06 MED ORDER — HYDROCODONE-ACETAMINOPHEN 5-325 MG PO TABS: 1.0000 | ORAL_TABLET | ORAL | Status: DC | PRN

## 2016-01-06 MED ORDER — HYDROCODONE-ACETAMINOPHEN 5-325 MG PO TABS
1.0000 | ORAL_TABLET | ORAL | Status: DC | PRN
  Administered 2016-01-06 (×2): 1 via ORAL
  Filled 2016-01-06 (×2): qty 1

## 2016-01-06 MED ORDER — LATANOPROST 0.005 % OP SOLN
1.0000 [drp] | Freq: Every day | OPHTHALMIC | Status: DC
  Administered 2016-01-06 – 2016-01-08 (×3): 1 [drp] via OPHTHALMIC
  Filled 2016-01-06: qty 2.5

## 2016-01-06 NOTE — ED Notes (Addendum)
11:56 AM Facility [facility]) called to check in on patient. Amy Drake (Barista) [phone] Clarified events leading up to fall. Per Ms Amy Drake, pt was walking throughout day room when her foot got caught on another residents walker resulting in a fall. There was no LOC. Pt immediately complained of hip pain. 911 was called.   12:20 PM Ms Amy Drake aware of admission. States she will call her emergency contact. According to facility, her sister is no longer her emergency contact since she herself was committed to a facility. Pt chart updated.  Court appointed legal guardian per [employer]:  Hiram Gash with DSS [phone] EXT: [phone]

## 2016-01-06 NOTE — Progress Notes (Signed)
Spoke with Hiram Gash, SW with DSS.  Pt password set up as Shlonda.  Only family member to receive information is [employer].

## 2016-01-06 NOTE — Consult Note (Signed)
Reason for Consult:  left hip fracture Referring Physician: Dr Rene Paci [name] is an 70 y.o. female.  HPI:  Taken from internal medicine no one available to give history   Amy Drake WVP:[id number] DOB: 10-26-1946 DOA: 01/06/2016  Referring physician: ED physician PCP: Amy Neighbors, MD   Chief Complaint: fall  HPI:   70 year old female with history of dementia, HTN and right hip arthroplasty admitted for left femoral neck fracture.  Patient is a resident of a skilled nursing facility and had a witnessed fall .  Fall was described as a trip over another resident.  Patient is mostly nonverbal (she will utter words but not in response to questions and at most times incomprehensibly).    Family Communication: patient legal guardian (DDS) Amy Drake [phone]; Amy Drake [phone]   Past Medical History  Diagnosis Date  . Dementia   . Depression   . Hypertension   . HCAP (healthcare-associated pneumonia) 02/16/2012  . Acute gastroenteritis 02/16/2012  . UTI (lower urinary tract infection) 02/16/2012    Past Surgical History  Procedure Laterality Date  . Appendectomy    . Tonsillectomy    . Total hip arthroplasty      right    Family History  Problem Relation Age of Onset  . Diabetes type II Mother   . Hypertension Father     Social History:  reports that she has quit smoking. Her smoking use included Cigarettes. She has a 16.5 pack-year smoking history. She does not have any smokeless tobacco history on file. She reports that she does not drink alcohol or use illicit drugs.  Allergies: No Known Allergies  Medications: I have reviewed the patient's current medications.  Results for orders placed or performed during the hospital encounter of 01/06/16 (from the past 48 hour(s))  CBC with Differential/Platelet     Status: Abnormal   Collection Time: 01/06/16 11:16 AM  Result Value Ref Range   WBC 11.8 (H) 4.0 - 10.5 K/uL   RBC 4.78 3.87 - 5.11  MIL/uL   Hemoglobin 13.8 12.0 - 15.0 g/dL   HCT 42.7 36.0 - 46.0 %   MCV 89.3 78.0 - 100.0 fL   MCH 28.9 26.0 - 34.0 pg   MCHC 32.3 30.0 - 36.0 g/dL   RDW 13.0 11.5 - 15.5 %   Platelets 226 150 - 400 K/uL   Neutrophils Relative % 91 %   Neutro Abs 10.7 (H) 1.7 - 7.7 K/uL   Lymphocytes Relative 5 %   Lymphs Abs 0.6 (L) 0.7 - 4.0 K/uL   Monocytes Relative 4 %   Monocytes Absolute 0.5 0.1 - 1.0 K/uL   Eosinophils Relative 0 %   Eosinophils Absolute 0.0 0.0 - 0.7 K/uL   Basophils Relative 0 %   Basophils Absolute 0.0 0.0 - 0.1 K/uL  Basic metabolic panel     Status: Abnormal   Collection Time: 01/06/16 11:16 AM  Result Value Ref Range   Sodium 142 135 - 145 mmol/L   Potassium 4.7 3.5 - 5.1 mmol/L   Chloride 108 101 - 111 mmol/L   CO2 28 22 - 32 mmol/L   Glucose, Bld 133 (H) 65 - 99 mg/dL   BUN 18 6 - 20 mg/dL   Creatinine, Ser 0.87 0.44 - 1.00 mg/dL   Calcium 9.5 8.9 - 10.3 mg/dL   GFR calc non Af Amer >60 >60 mL/min   GFR calc Af Amer >60 >60 mL/min  Comment: (NOTE) The eGFR has been calculated using the CKD EPI equation. This calculation has not been validated in all clinical situations. eGFR's persistently <60 mL/min signify possible Chronic Kidney Disease.    Anion gap 6 5 - 15  Prepare RBC     Status: None   Collection Time: 01/06/16  1:44 PM  Result Value Ref Range   Order Confirmation ORDER PROCESSED BY BLOOD BANK   Type and screen [facility]     Status: None   Collection Time: 01/06/16  1:44 PM  Result Value Ref Range   ABO/RH(D) O POS    Antibody Screen NEG    Sample Expiration 01/09/2016    Unit Number [id number]    Blood Component Type RED CELLS,LR    Unit division 00    Status of Unit REL FROM [facility]    Transfusion Status OK TO TRANSFUSE    Crossmatch Result Compatible    Unit Number [id number]    Blood Component Type RBC LR PHER1    Unit division 00    Status of Unit REL FROM [facility]    Transfusion Status OK TO TRANSFUSE    Crossmatch  Result Compatible   ABO/Rh     Status: None   Collection Time: 01/06/16  1:44 PM  Result Value Ref Range   ABO/RH(D) O POS     Chest Portable 1 View  01/06/2016  CLINICAL DATA:  Fall, dementia EXAM: PORTABLE CHEST 1 VIEW COMPARISON:  Radiograph 02/15/2012 FINDINGS: Normal cardiac silhouette. Fine interstitial pattern not changed from prior. No effusion, infiltrate or pneumothorax. IMPRESSION: Fine interstitial pattern suggests chronic interstitial lung disease. No significant change from prior. No acute findings. Electronically Signed   By: Suzy Bouchard M.D.   On: 01/06/2016 14:49   Dg Hips Bilat With Pelvis 3-4 Views  01/06/2016  CLINICAL DATA:  Left hip pain post fall EXAM: DG HIP (WITH OR WITHOUT PELVIS) 3-4V BILAT COMPARISON:  None. FINDINGS: Five views bilateral hip submitted. There is right hip prosthesis with anatomic alignment. No evidence of prosthesis loosening. Displaced fracture of the left femoral neck is noted. IMPRESSION: Displaced fracture of the left femoral neck. Right hip prosthesis with anatomic alignment. Electronically Signed   By: Lahoma Crocker M.D.   On: 01/06/2016 12:00    ROS Blood pressure 142/83, pulse 84, temperature 98.2 F (36.8 C), temperature source Axillary, resp. rate 20, height '5\' 5"'$  (1.651 m), weight 102 lb 15.3 oz (46.7 kg), SpO2 96 %. Physical Exam  Constitutional: She appears well-developed. No distress.  HENT:  Head: Normocephalic and atraumatic.  Mouth/Throat: No oropharyngeal exudate.  Eyes: Right eye exhibits no discharge. Left eye exhibits no discharge. No scleral icterus.  Neck: Neck supple. No JVD present. No tracheal deviation present. No thyromegaly present.  Cardiovascular: Intact distal pulses.   Respiratory: No stridor. No respiratory distress. She has no wheezes.  GI: Soft. She exhibits no distension. There is no tenderness.  Lymphadenopathy:    She has no cervical adenopathy.  Neurological: She exhibits normal muscle tone.  Not  oriented 3, no obvious cranial nerve defects, coordination tests could not be assessed because of her dementia, upper extremities normal reflexes and muscle tone, lower extremities normal muscle tone, normal response to sensory stimuli pressure, pain  Skin: Skin is warm and dry. No rash noted. She is not diaphoretic. No erythema. No pallor.  Psychiatric: She has a normal mood and affect. Her behavior is normal. Judgment and thought content normal.   upper extremities alignment  is normal with minimal contractures and some mild deformity secondary to degeneration of small joints of the hand and contractures. Range of motion otherwise normal. All joints reduced without subluxation. Muscle tone remains normal.  Right lower extremity mild contracture normal alignment. Range of motion stability and muscle tone normal. Left lower extremity tender proximal femur/hip rotatory malalignment swelling. Knee and ankle reduced without subluxation. Muscle tone normal.    Assessment/Plan: Left hip basi cervical hip fracture (intertrochanteric equivalent)  Dementia/soc services appointed guardian  The patient has a left hip fracture and cannot ambulate without fixation. She will deteriorate if she stays in bed waiting for this fracture to heal. The best treatment for her is internal fixation. I will choose intramedullary nailing with short gamma nail which should stabilize the fracture and allow early transfers weightbearing and prevent fracture disease.  The risks and benefits of this surgery versus nonoperative treatment have been explained to the guardian and include but are not limited to Bleeding Infection Blood clot Pulmonary embolus and death Anesthetic complications relating and death Pneumonia Urinary tract infection Worsening dementia Bedsores Fracture failure of healing so-called nonunion Malunion Hardware failure    Arther Abbott 01/06/2016, 6:57 PM

## 2016-01-06 NOTE — ED Notes (Addendum)
Per facility staff, pt was walking when her foot was caught on another residents walker resulting in a witnessed trip and fall. Pt initially complained of left hip pain but during exam moans with touch to bilateral hips.

## 2016-01-06 NOTE — ED Provider Notes (Signed)
CSN: [id number]     Arrival date & time 01/06/16  0865 History   First MD Initiated Contact with Patient 01/06/16 1108     Chief Complaint  Patient presents with  . Hip Pain     (Consider location/radiation/quality/duration/timing/severity/associated sxs/prior Treatment) HPI   70 year old female with significant history of dementia currently living in a facility brought here via EMS for evaluation of a witnessed fall. Per nurse, the nursing staff saw patient tripped over another resident's walker, fell and has been moaning when her hips a touch. No report of loss of consciousness. No other complaint. Patient has significant dementia therefore level V caveats applied.    Past Medical History  Diagnosis Date  . Dementia   . Depression   . Hypertension    Past Surgical History  Procedure Laterality Date  . Appendectomy    . Tonsillectomy    . Total hip arthroplasty      right   Family History  Problem Relation Age of Onset  . Diabetes type II Mother   . Hypertension Father    Social History  Substance Use Topics  . Smoking status: Former Smoker -- 0.33 packs/day for 50 years    Types: Cigarettes  . Smokeless tobacco: None  . Alcohol Use: No   OB History    No data available     Review of Systems  Unable to perform ROS: Dementia      Allergies  Review of patient's allergies indicates no known allergies.  Home Medications   Prior to Admission medications   Medication Sig Start Date End Date Taking? Authorizing Provider  acetaminophen (TYLENOL) 325 MG tablet Take 650 mg by mouth every 6 (six) hours as needed for mild pain.   Yes Historical Provider, MD  amLODipine (NORVASC) 2.5 MG tablet Take 2.5 mg by mouth daily.   Yes Historical Provider, MD  latanoprost (XALATAN) 0.005 % ophthalmic solution Place 1 drop into both eyes at bedtime.   Yes Historical Provider, MD   BP 176/105 mmHg  Pulse 78  Temp(Src) 97.5 F (36.4 C) (Oral)  Resp 18  Ht  (1.651 m)  Wt  58.968 kg  BMI 21.63 kg/m2  SpO2 97% Physical Exam  Constitutional: She appears well-developed and well-nourished. No distress.  Pleasantly demented African-American female resting in bed in no acute discomfort. She is talking to herself.  HENT:  Head: Normocephalic and atraumatic.  No scalp tenderness crepitus or overlying skin changes. No midface tenderness. No raccoons eyes or battle signs.  Eyes: Conjunctivae are normal.  Neck: Normal range of motion. Neck supple.  No cervical midline spine tenderness crepitus or step-off  Cardiovascular: Normal rate and regular rhythm.   Pulmonary/Chest: Effort normal and breath sounds normal.  Abdominal: Soft. There is no tenderness.  Musculoskeletal: She exhibits tenderness (patient moans when her hips are manipulated no gross deformity appreciated.).  No tenderness along midline spine on palpation.  Neurological: She is alert.  Patient is alert to self only.  Skin: No rash noted.  Psychiatric: She has a normal mood and affect.  Nursing note and vitals reviewed.   ED Course  Procedures (including critical care time) Labs Review Labs Reviewed  CBC WITH DIFFERENTIAL/PLATELET - Abnormal; Notable for the following:    WBC 11.8 (*)    Neutro Abs 10.7 (*)    Lymphs Abs 0.6 (*)    All other components within normal limits  BASIC METABOLIC PANEL - Abnormal; Notable for the following:    Glucose, Bld  133 (*)    All other components within normal limits  URINALYSIS, ROUTINE W REFLEX MICROSCOPIC (NOT AT [facility])    Imaging Review Dg Hips Bilat With Pelvis 3-4 Views  01/06/2016  CLINICAL DATA:  Left hip pain post fall EXAM: DG HIP (WITH OR WITHOUT PELVIS) 3-4V BILAT COMPARISON:  None. FINDINGS: Five views bilateral hip submitted. There is right hip prosthesis with anatomic alignment. No evidence of prosthesis loosening. Displaced fracture of the left femoral neck is noted. IMPRESSION: Displaced fracture of the left femoral neck. Right hip prosthesis  with anatomic alignment. Electronically Signed   By: [name] M.D.   On: 01/06/2016 12:00   I have personally reviewed and evaluated these images and lab results as part of my medical decision-making.   EKG Interpretation   Date/Time:  Friday January 06 2016 12:04:07 EST Ventricular Rate:  82 PR Interval:  177 QRS Duration: 84 QT Interval:  392 QTC Calculation: 458 R Axis:   86 Text Interpretation:  Sinus rhythm Anteroseptal infarct, age indeterminate  Confirmed by ZAMMIT  MD, JOSEPH [id number]) on 01/06/2016 12:06:31 PM      MDM   Final diagnoses:  Trauma  Hip fracture, left, closed, initial encounter (HCC)    BP 147/90 mmHg  Pulse 84  Temp(Src) 97.5 F (36.4 C) (Oral)  Resp 19  Ht  (1.651 m)  Wt 58.968 kg  BMI 21.63 kg/m2  SpO2 100%   11:32 AM Pleasantly demented elderly female here for evaluation of a mechanical fall. Since patient unable to give a history, we will obtain basic labs, EKG, UA, and bilateral hip x-rays. She is afebrile, mildly hypertensive with a blood pressure of 163/89.  12:14 PM Hip x-ray demonstrated a displaced fractures of the left femoral neck. Right hip prosthesis with anatomic alignment. Since patient is demented and I am unable to find out her previous orthopedist. I will consult on call orthopedist for further management. Pain medication given. This is a closed injury. She is neurovascularly intact. Care discussed with Dr. Estell Harpin.    12:20 PM Care discussed with Orthopedist Dr. [name] who request medicine for admission and he will be available for consultation and management.    12:47 PM Appreciate consultation from [facility] Dr. Sherrie Mustache who agrees to admit pt to med surgery bed under her care.  Holding orders placed.  Our nurse will attempt to reach out to pt's family member to notify plan.    Fayrene Helper, PA-C 01/06/16 1249  Bethann Berkshire, MD 01/09/16 2149

## 2016-01-06 NOTE — ED Notes (Signed)
Pt here from [facility] for evaluation of hip pain from a fall. Unsure which hip. Pt moans when both touched. Pt has dementia and unable to say

## 2016-01-06 NOTE — H&P (Signed)
History and Physical  Amy Drake ZOX:[id number] DOB: 08/01/1946 DOA: 01/06/2016  Referring physician: ED physician PCP: [name], MD   Chief Complaint: fall  HPI:  70 year old female with history of dementia, HTN and right hip arthroplasty admitted for left femoral neck fracture.  Patient is a resident of a skilled nursing facility and had a witnessed fall .  Fall was described as a trip over another resident.  Patient is mostly nonverbal (she will utter words but not in response to questions and at most times incomprehensibly).     In the emergency department hip and pelvis xrays revealed a left femoral neck fracture.  Orthopaedic surgery consulted. Pertinent labs: below EKG: Independently reviewed.  Imaging: independently reviewed.   Review of Systems:  Unable to obtain as patient is mostly nonverbal and demented   Past Medical History  Diagnosis Date  . Dementia   . Depression   . Hypertension   . HCAP (healthcare-associated pneumonia) 02/16/2012  . Acute gastroenteritis 02/16/2012  . UTI (lower urinary tract infection) 02/16/2012    Past Surgical History  Procedure Laterality Date  . Appendectomy    . Tonsillectomy    . Total hip arthroplasty      right    Social History: Previous history reports that she has quit smoking. Her smoking use included Cigarettes. She has a 16.5 pack-year smoking history. She does not have any smokeless tobacco history on file. Previous history: she does not drink alcohol or use illicit drugs. lives in a skilled nursing facility. Patient is ambulatory but unable to assess if she needs assistance with dressing or eating.  No Known Allergies  Family History  Problem Relation Age of Onset  . Diabetes type II Mother   . Hypertension Father      Prior to Admission medications   Medication Sig Start Date End Date Taking? Authorizing Provider  acetaminophen (TYLENOL) 325 MG tablet Take 650 mg by mouth every 6 (six) hours as needed for  mild pain.   Yes Historical Provider, MD  amLODipine (NORVASC) 2.5 MG tablet Take 2.5 mg by mouth daily.   Yes Historical Provider, MD  latanoprost (XALATAN) 0.005 % ophthalmic solution Place 1 drop into both eyes at bedtime.   Yes Historical Provider, MD   Physical Exam: Filed Vitals:   01/06/16 0944 01/06/16 1000 01/06/16 1206 01/06/16 1230  BP: 176/105 163/89 147/102 147/90  Pulse: 78  84   Temp: 97.5 F (36.4 C)     TempSrc: Oral     Resp: Height:  (1.651 m)     Weight: 58.968 kg (130 lb)     SpO2: 97% 95% 100%      General:  Appears anxious Eyes: PERRL, normal lids, irises & conjunctiva, arcus senilis ENT: grossly normal hearing, dry mouth Neck: no LAD, masses or thyromegaly Cardiovascular: RRR, no m/r/g. No LE edema. Respiratory: CTA bilaterally, no w/r/r. Normal respiratory effort. Abdomen: soft, ntnd Skin: no rash or induration seen on limited exam Musculoskeletal: unable to assess- patient did wince when I palpated the left hip and thigh Psychiatric: nonverbal appears agitated Neurologic: unable to assess  Wt Readings from Last 3 Encounters:  01/06/16 58.968 kg (130 lb)  06/14/15 63.504 kg (140 lb)  03/12/15 63.504 kg (140 lb)    Labs on Admission:  Basic Metabolic Panel:  Recent Labs Lab 01/06/16 1116  NA 142  K 4.7  CL 108  CO2 28  GLUCOSE 133*  BUN  18  CREATININE 0.87  CALCIUM 9.5    Liver Function Tests: No results for input(s): AST, ALT, ALKPHOS, BILITOT, PROT, ALBUMIN in the last 168 hours. No results for input(s): LIPASE, AMYLASE in the last 168 hours. No results for input(s): AMMONIA in the last 168 hours.  CBC:  Recent Labs Lab 01/06/16 1116  WBC 11.8*  NEUTROABS 10.7*  HGB 13.8  HCT 42.7  MCV 89.3  PLT 226    Cardiac Enzymes: No results for input(s): CKTOTAL, CKMB, CKMBINDEX, TROPONINI in the last 168 hours.  Troponin (Point of Care Test) No results for input(s): TROPIPOC in the last 72 hours.  BNP (last  3 results) No results for input(s): PROBNP in the last 8760 hours.  CBG: No results for input(s): GLUCAP in the last 168 hours.   Radiological Exams on Admission: Dg Hips Bilat With Pelvis 3-4 Views  01/06/2016  CLINICAL DATA:  Left hip pain post fall EXAM: DG HIP (WITH OR WITHOUT PELVIS) 3-4V BILAT COMPARISON:  None. FINDINGS: Five views bilateral hip submitted. There is right hip prosthesis with anatomic alignment. No evidence of prosthesis loosening. Displaced fracture of the left femoral neck is noted. IMPRESSION: Displaced fracture of the left femoral neck. Right hip prosthesis with anatomic alignment. Electronically Signed   By: [name] M.D.   On: 01/06/2016 12:00      Active Problems:   Hip fracture, left (HCC)   Assessment/Plan 1. Left femoral neck fracture: Ortho. surg consulted, I spoke with Dr. [name] who states patient will be going to surgery tomorrow morning.  Ordered 1 dose of heparin now and 1 just prior to midnight.  Patient will be placed on heart healthy diet then npo after midnight.  She is in the custody of DSS and patients guardian numbers are listed below.  Pain control with morphine 2. Hypertension: continued patients home dose of amlodipine 3. Dementia: patient is at her baseline? Previous notes mention patient was nonverbal 4. DVT prophylaxis: as mentioned above heparin  Code Status: full code  DVT prophylaxis:SCDs awaiting to hear from orthopaedic surgery if chemical DVT prophylaxis can be ordered Family Communication: patient legal guardian (DDS) Terrence Dupont [phone]; Barnie Alderman [phone] Disposition Plan/Anticipated LOS: pending improvement  Time spent: 25 minutes  Reuben Likes, MD  [facility]  01/06/2016, 1:08 PM

## 2016-01-07 ENCOUNTER — Encounter (HOSPITAL_COMMUNITY)
Admission: EM | Disposition: A | Discharge status: Skilled Nursing Facility | Payer: Self-pay | Source: Home / Self Care | Attending: Internal Medicine

## 2016-01-07 ENCOUNTER — Inpatient Hospital Stay (HOSPITAL_COMMUNITY): Payer: Medicare Other

## 2016-01-07 ENCOUNTER — Inpatient Hospital Stay (HOSPITAL_COMMUNITY): Payer: Medicare Other | Admitting: Anesthesiology

## 2016-01-07 ENCOUNTER — Encounter (HOSPITAL_COMMUNITY): Payer: Self-pay | Admitting: Anesthesiology

## 2016-01-07 DIAGNOSIS — W19XXXA Unspecified fall, initial encounter: Secondary | ICD-10-CM | POA: Insufficient documentation

## 2016-01-07 DIAGNOSIS — W19XXXD Unspecified fall, subsequent encounter: Secondary | ICD-10-CM

## 2016-01-07 DIAGNOSIS — S72002A Fracture of unspecified part of neck of left femur, initial encounter for closed fracture: Secondary | ICD-10-CM | POA: Insufficient documentation

## 2016-01-07 HISTORY — PX: INTRAMEDULLARY (IM) NAIL INTERTROCHANTERIC: SHX5875

## 2016-01-07 LAB — URINALYSIS, ROUTINE W REFLEX MICROSCOPIC
Bilirubin Urine: NEGATIVE
Glucose, UA: NEGATIVE mg/dL
Ketones, ur: NEGATIVE mg/dL
NITRITE: NEGATIVE
Specific Gravity, Urine: 1.015 (ref 1.005–1.030)
pH: 6 (ref 5.0–8.0)

## 2016-01-07 LAB — URINE MICROSCOPIC-ADD ON

## 2016-01-07 LAB — SURGICAL PCR SCREEN
MRSA, PCR: NEGATIVE
Staphylococcus aureus: NEGATIVE

## 2016-01-07 SURGERY — FIXATION, FRACTURE, INTERTROCHANTERIC, WITH INTRAMEDULLARY ROD
Anesthesia: Spinal | Site: Hip | Laterality: Left

## 2016-01-07 MED ORDER — DEXAMETHASONE SODIUM PHOSPHATE 4 MG/ML IJ SOLN
INTRAMUSCULAR | Status: DC | PRN
  Administered 2016-01-07: 8 mg via INTRAVENOUS

## 2016-01-07 MED ORDER — FENTANYL CITRATE (PF) 100 MCG/2ML IJ SOLN
INTRAMUSCULAR | Status: DC | PRN
  Administered 2016-01-07: 25 ug via INTRATHECAL

## 2016-01-07 MED ORDER — BUPIVACAINE-EPINEPHRINE (PF) 0.5% -1:200000 IJ SOLN
INTRAMUSCULAR | Status: DC | PRN
  Administered 2016-01-07: 30 mL via PERINEURAL

## 2016-01-07 MED ORDER — FENTANYL CITRATE (PF) 100 MCG/2ML IJ SOLN
INTRAMUSCULAR | Status: AC
  Filled 2016-01-07: qty 2

## 2016-01-07 MED ORDER — DEXAMETHASONE SODIUM PHOSPHATE 4 MG/ML IJ SOLN
INTRAMUSCULAR | Status: AC
  Filled 2016-01-07: qty 2

## 2016-01-07 MED ORDER — BUPIVACAINE-EPINEPHRINE (PF) 0.5% -1:200000 IJ SOLN
INTRAMUSCULAR | Status: AC
  Filled 2016-01-07: qty 60

## 2016-01-07 MED ORDER — SODIUM CHLORIDE 0.9 % IJ SOLN
INTRAMUSCULAR | Status: AC
  Filled 2016-01-07: qty 10

## 2016-01-07 MED ORDER — PHENYLEPHRINE 40 MCG/ML (10ML) SYRINGE FOR IV PUSH (FOR BLOOD PRESSURE SUPPORT)
PREFILLED_SYRINGE | INTRAVENOUS | Status: AC
  Filled 2016-01-07: qty 10

## 2016-01-07 MED ORDER — TRAMADOL HCL 50 MG PO TABS: 50.0000 mg | ORAL_TABLET | Freq: Four times a day (QID) | ORAL | Status: DC

## 2016-01-07 MED ORDER — ACETAMINOPHEN 650 MG RE SUPP: 650.0000 mg | Freq: Four times a day (QID) | RECTAL | Status: DC | PRN

## 2016-01-07 MED ORDER — PROPOFOL 10 MG/ML IV BOLUS
INTRAVENOUS | Status: AC
  Filled 2016-01-07: qty 40

## 2016-01-07 MED ORDER — LIDOCAINE HCL (PF) 1 % IJ SOLN
INTRAMUSCULAR | Status: AC
  Filled 2016-01-07: qty 5

## 2016-01-07 MED ORDER — EPHEDRINE SULFATE 50 MG/ML IJ SOLN
INTRAMUSCULAR | Status: AC
  Filled 2016-01-07: qty 1

## 2016-01-07 MED ORDER — METOCLOPRAMIDE HCL 5 MG/ML IJ SOLN: 5.0000 mg | Freq: Three times a day (TID) | INTRAMUSCULAR | Status: DC | PRN

## 2016-01-07 MED ORDER — ATROPINE SULFATE 0.4 MG/ML IJ SOLN
INTRAMUSCULAR | Status: AC
  Filled 2016-01-07: qty 1

## 2016-01-07 MED ORDER — BUPIVACAINE IN DEXTROSE 0.75-8.25 % IT SOLN
INTRATHECAL | Status: AC
  Filled 2016-01-07: qty 2

## 2016-01-07 MED ORDER — GLYCOPYRROLATE 0.2 MG/ML IJ SOLN
INTRAMUSCULAR | Status: AC
  Filled 2016-01-07: qty 1

## 2016-01-07 MED ORDER — METOCLOPRAMIDE HCL 10 MG PO TABS: 5.0000 mg | ORAL_TABLET | Freq: Three times a day (TID) | ORAL | Status: DC | PRN

## 2016-01-07 MED ORDER — CEFAZOLIN SODIUM-DEXTROSE 2-3 GM-% IV SOLR
2.0000 g | Freq: Four times a day (QID) | INTRAVENOUS | Status: AC
  Administered 2016-01-07 (×2): 2 g via INTRAVENOUS
  Filled 2016-01-07 (×2): qty 50

## 2016-01-07 MED ORDER — PHENYLEPHRINE HCL 10 MG/ML IJ SOLN
INTRAMUSCULAR | Status: DC | PRN
  Administered 2016-01-07 (×6): 80 ug via INTRAVENOUS

## 2016-01-07 MED ORDER — ONDANSETRON HCL 4 MG/2ML IJ SOLN
INTRAMUSCULAR | Status: DC | PRN
  Administered 2016-01-07: 4 mg via INTRAVENOUS

## 2016-01-07 MED ORDER — PHENOL 1.4 % MT LIQD
1.0000 | OROMUCOSAL | Status: DC | PRN
Start: 2016-01-07 — End: 2016-01-09

## 2016-01-07 MED ORDER — ONDANSETRON HCL 4 MG/2ML IJ SOLN
INTRAMUSCULAR | Status: AC
  Filled 2016-01-07: qty 2

## 2016-01-07 MED ORDER — SODIUM CHLORIDE 0.9 % IR SOLN
Status: DC | PRN
  Administered 2016-01-07: 1000 mL

## 2016-01-07 MED ORDER — CEFAZOLIN SODIUM-DEXTROSE 2-3 GM-% IV SOLR
INTRAVENOUS | Status: AC
  Filled 2016-01-07: qty 50

## 2016-01-07 MED ORDER — MIDAZOLAM HCL 2 MG/2ML IJ SOLN
INTRAMUSCULAR | Status: AC
  Filled 2016-01-07: qty 2

## 2016-01-07 MED ORDER — SUCCINYLCHOLINE CHLORIDE 20 MG/ML IJ SOLN
INTRAMUSCULAR | Status: AC
  Filled 2016-01-07: qty 1

## 2016-01-07 MED ORDER — TRAMADOL HCL 50 MG PO TABS
50.0000 mg | ORAL_TABLET | Freq: Four times a day (QID) | ORAL | Status: DC
  Administered 2016-01-07 – 2016-01-09 (×7): 50 mg via ORAL
  Filled 2016-01-07 (×8): qty 1

## 2016-01-07 MED ORDER — ASPIRIN EC 325 MG PO TBEC
325.0000 mg | DELAYED_RELEASE_TABLET | Freq: Every day | ORAL | Status: DC
  Administered 2016-01-08 – 2016-01-09 (×2): 325 mg via ORAL
  Filled 2016-01-07 (×2): qty 1

## 2016-01-07 MED ORDER — ACETAMINOPHEN 325 MG PO TABS: 650.0000 mg | ORAL_TABLET | Freq: Four times a day (QID) | ORAL | Status: DC | PRN

## 2016-01-07 MED ORDER — CHLORHEXIDINE GLUCONATE 4 % EX LIQD
60.0000 mL | Freq: Once | CUTANEOUS | Status: AC
  Administered 2016-01-07: 4 via TOPICAL
  Filled 2016-01-07: qty 30

## 2016-01-07 MED ORDER — SODIUM CHLORIDE 0.9 % IV SOLN
INTRAVENOUS | Status: DC
  Administered 2016-01-07 – 2016-01-09 (×4): via INTRAVENOUS

## 2016-01-07 MED ORDER — PROPOFOL 500 MG/50ML IV EMUL
INTRAVENOUS | Status: DC | PRN
  Administered 2016-01-07: 75 ug/kg/min via INTRAVENOUS
  Administered 2016-01-07: 10:00:00 via INTRAVENOUS

## 2016-01-07 MED ORDER — MIDAZOLAM HCL 5 MG/5ML IJ SOLN
INTRAMUSCULAR | Status: DC | PRN
  Administered 2016-01-07 (×2): 1 mg via INTRAVENOUS

## 2016-01-07 MED ORDER — MENTHOL 3 MG MT LOZG: 1.0000 | LOZENGE | OROMUCOSAL | Status: DC | PRN

## 2016-01-07 MED ORDER — LACTATED RINGERS IV SOLN
INTRAVENOUS | Status: DC | PRN
  Administered 2016-01-07 (×2): via INTRAVENOUS

## 2016-01-07 MED ORDER — BUPIVACAINE HCL (PF) 0.75 % IJ SOLN
INTRAMUSCULAR | Status: DC | PRN
  Administered 2016-01-07: 13 mg via INTRATHECAL

## 2016-01-07 MED ORDER — ONDANSETRON HCL 4 MG/2ML IJ SOLN: 4.0000 mg | Freq: Three times a day (TID) | INTRAMUSCULAR | Status: AC | PRN

## 2016-01-07 MED ORDER — ACETAMINOPHEN 10 MG/ML IV SOLN: 1000.0000 mg | Freq: Four times a day (QID) | INTRAVENOUS | Status: DC

## 2016-01-07 MED ORDER — ACETAMINOPHEN 10 MG/ML IV SOLN
1000.0000 mg | Freq: Four times a day (QID) | INTRAVENOUS | Status: AC
  Administered 2016-01-07 – 2016-01-08 (×4): 1000 mg via INTRAVENOUS
  Filled 2016-01-07 (×3): qty 100

## 2016-01-07 MED ORDER — GLYCOPYRROLATE 0.2 MG/ML IJ SOLN
INTRAMUSCULAR | Status: DC | PRN
  Administered 2016-01-07: 0.2 mg via INTRAVENOUS

## 2016-01-07 MED ORDER — CEFAZOLIN SODIUM-DEXTROSE 2-3 GM-% IV SOLR
2.0000 g | INTRAVENOUS | Status: AC
  Administered 2016-01-07: 2 g via INTRAVENOUS
  Filled 2016-01-07: qty 50

## 2016-01-07 SURGICAL SUPPLY — 53 items
BAG HAMPER (MISCELLANEOUS) ×3 IMPLANT
BIT DRILL AO GAMMA 4.2X300 (BIT) ×3 IMPLANT
BLADE HEX COATED 2.75 (ELECTRODE) ×3 IMPLANT
BLADE SURG SZ10 CARB STEEL (BLADE) ×6 IMPLANT
BNDG GAUZE ELAST 4 BULKY (GAUZE/BANDAGES/DRESSINGS) ×3 IMPLANT
CHLORAPREP W/TINT 26ML (MISCELLANEOUS) ×3 IMPLANT
CLOTH BEACON ORANGE TIMEOUT ST (SAFETY) ×3 IMPLANT
COVER LIGHT HANDLE STERIS (MISCELLANEOUS) ×6 IMPLANT
COVER MAYO STAND XLG (DRAPE) ×3 IMPLANT
DECANTER SPIKE VIAL GLASS SM (MISCELLANEOUS) ×6 IMPLANT
DRAPE STERI IOBAN 125X83 (DRAPES) ×3 IMPLANT
DRSG AQUACEL AG ADV 3.5X10 (GAUZE/BANDAGES/DRESSINGS) ×3 IMPLANT
DRSG MEPILEX BORDER 4X12 (GAUZE/BANDAGES/DRESSINGS) ×3 IMPLANT
ELECT REM PT RETURN 9FT ADLT (ELECTROSURGICAL) ×3
ELECTRODE REM PT RTRN 9FT ADLT (ELECTROSURGICAL) ×1 IMPLANT
GLOVE BIOGEL PI IND STRL 7.0 (GLOVE) ×1 IMPLANT
GLOVE BIOGEL PI INDICATOR 7.0 (GLOVE) ×2
GLOVE SKINSENSE NS SZ8.0 LF (GLOVE) ×2
GLOVE SKINSENSE STRL SZ8.0 LF (GLOVE) ×1 IMPLANT
GLOVE SS N UNI LF 8.5 STRL (GLOVE) ×3 IMPLANT
GOWN STRL REUS W/TWL LRG LVL3 (GOWN DISPOSABLE) ×9 IMPLANT
GOWN STRL REUS W/TWL XL LVL3 (GOWN DISPOSABLE) ×3 IMPLANT
GUIDEROD T2 3X1000 (ROD) ×3 IMPLANT
INST SET MAJOR BONE (KITS) ×3 IMPLANT
K-WIRE  3.2X450M STR (WIRE) ×2
K-WIRE 3.2X450M STR (WIRE) ×1
KIT BLADEGUARD II DBL (SET/KITS/TRAYS/PACK) ×3 IMPLANT
KIT ROOM TURNOVER AP CYSTO (KITS) ×3 IMPLANT
KWIRE 3.2X450M STR (WIRE) ×1 IMPLANT
MANIFOLD NEPTUNE II (INSTRUMENTS) ×3 IMPLANT
MARKER SKIN DUAL TIP RULER LAB (MISCELLANEOUS) ×3 IMPLANT
NAIL TROCH GAMMA 11X18 (Nail) ×3 IMPLANT
NEEDLE HYPO 21X1.5 SAFETY (NEEDLE) ×3 IMPLANT
NEEDLE SPNL 18GX3.5 QUINCKE PK (NEEDLE) ×3 IMPLANT
NS IRRIG 1000ML POUR BTL (IV SOLUTION) ×3 IMPLANT
PACK BASIC III (CUSTOM PROCEDURE TRAY) ×2
PACK SRG BSC III STRL LF ECLPS (CUSTOM PROCEDURE TRAY) ×1 IMPLANT
PENCIL HANDSWITCHING (ELECTRODE) ×3 IMPLANT
SCREW LAG GAMMA 3 95MM (Screw) ×3 IMPLANT
SCREW LOCKING T2 F/T  5X32.5MM (Screw) ×2 IMPLANT
SCREW LOCKING T2 F/T 5X32.5MM (Screw) ×1 IMPLANT
SET BASIN LINEN APH (SET/KITS/TRAYS/PACK) ×3 IMPLANT
SLING ARM FOAM STRAP LRG (SOFTGOODS) IMPLANT
SLING ARM FOAM STRAP MED (SOFTGOODS) ×3 IMPLANT
SLING ARM FOAM STRAP XLG (SOFTGOODS) IMPLANT
SPONGE LAP 18X18 X RAY DECT (DISPOSABLE) ×6 IMPLANT
STAPLER VISISTAT 35W (STAPLE) IMPLANT
SUT MNCRL 0 VIOLET CTX 36 (SUTURE) ×1 IMPLANT
SUT MON AB 2-0 CT1 36 (SUTURE) ×3 IMPLANT
SUT MONOCRYL 0 CTX 36 (SUTURE) ×2
SYR 30ML LL (SYRINGE) ×3 IMPLANT
SYR BULB IRRIGATION 50ML (SYRINGE) ×6 IMPLANT
YANKAUER SUCT BULB TIP NO VENT (SUCTIONS) ×3 IMPLANT

## 2016-01-07 NOTE — Anesthesia Preprocedure Evaluation (Addendum)
Anesthesia Evaluation  Patient identified by MRN, date of birth, ID band Patient confused    Reviewed: Allergy & Precautions, NPO status , Patient's Chart, lab work & pertinent test results, reviewed documented beta blocker date and time   Airway Mallampati: II  TM Distance: >3 FB Neck ROM: Full    Dental   Pulmonary former smoker,    breath sounds clear to auscultation       Cardiovascular hypertension, Pt. on medications  Rhythm:Regular Rate:Normal     Neuro/Psych Depression Dementia; utters words, incomprehensible    GI/Hepatic   Endo/Other    Renal/GU      Musculoskeletal   Abdominal   Peds  Hematology   Anesthesia Other Findings Discussed anesthesia plan with Denyce Robert of [facility] DSS.   Questions answered, consent given for anesthesia.  Reproductive/Obstetrics                          Anesthesia Physical Anesthesia Plan  ASA: III and emergent  Anesthesia Plan: Spinal   Post-op Pain Management:    Induction:   Airway Management Planned: Simple Face Mask  Additional Equipment:   Intra-op Plan:   Post-operative Plan:   Informed Consent: I have reviewed the patients History and Physical, chart, labs and discussed the procedure including the risks, benefits and alternatives for the proposed anesthesia with the patient or authorized representative who has indicated his/her understanding and acceptance.   Consent reviewed with POA and Dental advisory given  Plan Discussed with: Surgeon and Anesthesiologist  Anesthesia Plan Comments:         Anesthesia Quick Evaluation

## 2016-01-07 NOTE — Brief Op Note (Signed)
01/07/2016  10:53 AM  PATIENT:  Amy Drake  70 y.o. female  PRE-OPERATIVE DIAGNOSIS:  LEFT HIP FRACTURE FEMORAL NECK-basicervical  POST-OPERATIVE DIAGNOSIS:  LEFT HIP FRACTURE FEMORAL NECK-basicervical  Surgical findings severe external rotation of the shaft related to the neck and head with apex anterior angulation of the fracture. This was corrected with traction and internal rotation.  Two-part fracture with intact posterior medial buttress  The procedure was done as follows  Primary indication fractured left hip.  The patient was identified in the preop area the left hip was confirmed as surgical site chart update completed left hip marked for surgery.  Patient taken to surgery spinal anesthesia and 2 g of Ancef given IV.  On the fracture table the right leg was abducted externally rotated and flexed and placed in a padded leg holder the left leg was placed in traction. The C-arm was brought in and traction reduced a varus angulation and then internal rotation to match the proximal fragment was done. This gave anatomic reduction. Reduction was stable.  Leg was prepped and then draped sterilely.  Timeout was executed  I checked the implants and x-rays prior to surgery  After the timeout the incision was made at the proximal femur over the trochanter extended proximally subcutaneous tissue was divided. Fascia was split in line with the skin incision and the greater trochanter was visualized and palpated. X-rays were used to confirm entry of the curved awl into the femoral canal. A guidepin was placed through the cannulated awl and this was confirmed by x-ray to be center.  Proximal reaming was performed per manufacture technique this was done over the guidewire.  125 nail was placed. Perforating drill was used to breach the lateral cortex threaded tip guidewire was placed in the center the femoral head in the slightly inferior position on the AP and center on the lateral. Once  this was confirmed by x-ray the pin was measured to be 95 mm. A 95 lag screw was chosen and placed after over reaming the threaded tip guidewire with the reaming device. Lag screw was placed. Confirmed by x-ray. Acorn screw was placed and backed off a quarter turn to place it in sliding mode. Toggling the screw driver handle on the lag screw confirmed engagement.  The distal screw was placed through a stab incision using a sharp trocar and cannula. This was drilled with appropriate drill bit and measured off the drill bit to 32.5 mm this was placed and confirmed by x-ray  Our hardware position and reduction were confirmed by x-ray.  Wounds were thoroughly irrigated  Proximal wound was closed with 2 layers of 0 Monocryl  Second and third wounds were closed with 0 Monocryl as well. Staples were used to reapproximate the skin edges  The 30 mL of Marcaine was injected in the proximal wound in the subfascial plane prior to closure  Sterile dressing was applied  The limbs were taken out of traction and off the well leg holder and checked for rotation and found to be equal with both legs showing slight internal rotation.  The Foley catheter was leaking and a new Foley catheter was placed  Postop plan  Weightbearing as tolerated Staples out postop day 12-14 First x-ray can be taken postop day #28 First follow-up visit can be done on postop day #28 Anticoagulation aspirin once a day for 28 days    PROCEDURE:  Procedure(s): INTRAMEDULLARY (IM) NAIL INTERTROCHANTRIC (Left)  Gamma nail 125 degree short 95 lag screw    32.5 distal bolt Acorn in slide mode    SURGEON:  Surgeon(s) and Role:    * Vickki Hearing, MD - Primary  Assistants none  Anesthesia spinal  Blood administered none  Complications none  Marcaine with epinephrine 30 mL injected subfascial   EBL:  Total I/O In: 1400 [I.V.:1400] Out: 750 [Urine:700; Blood:50]  There were no drains    no  specimens  Counts correct  Dragon dictation   TOURNIQUET:  * No tourniquets in log *   PLAN OF CARE: Admit to inpatient   PATIENT DISPOSITION:  PACU - hemodynamically stable.   Delay start of Pharmacological VTE agent (>24hrs) due to surgical blood loss or risk of bleeding: yes

## 2016-01-07 NOTE — Op Note (Signed)
01/07/2016  10:53 AM  PATIENT:  Amy Drake  70 y.o. female  PRE-OPERATIVE DIAGNOSIS:  LEFT HIP FRACTURE FEMORAL NECK-basicervical  POST-OPERATIVE DIAGNOSIS:  LEFT HIP FRACTURE FEMORAL NECK-basicervical  Surgical findings severe external rotation of the shaft related to the neck and head with apex anterior angulation of the fracture. This was corrected with traction and internal rotation.  Two-part fracture with intact posterior medial buttress  The procedure was done as follows  Primary indication fractured left hip.  The patient was identified in the preop area the left hip was confirmed as surgical site chart update completed left hip marked for surgery.  Patient taken to surgery spinal anesthesia and 2 g of Ancef given IV.  On the fracture table the right leg was abducted externally rotated and flexed and placed in a padded leg holder the left leg was placed in traction. The C-arm was brought in and traction reduced a varus angulation and then internal rotation to match the proximal fragment was done. This gave anatomic reduction. Reduction was stable.  Leg was prepped and then draped sterilely.  Timeout was executed  I checked the implants and x-rays prior to surgery  After the timeout the incision was made at the proximal femur over the trochanter extended proximally subcutaneous tissue was divided. Fascia was split in line with the skin incision and the greater trochanter was visualized and palpated. X-rays were used to confirm entry of the curved awl into the femoral canal. A guidepin was placed through the cannulated awl and this was confirmed by x-ray to be center.  Proximal reaming was performed per manufacture technique this was done over the guidewire.  125 nail was placed. Perforating drill was used to breach the lateral cortex threaded tip guidewire was placed in the center the femoral head in the slightly inferior position on the AP and center on the lateral. Once  this was confirmed by x-ray the pin was measured to be 95 mm. A 95 lag screw was chosen and placed after over reaming the threaded tip guidewire with the reaming device. Lag screw was placed. Confirmed by x-ray. Acorn screw was placed and backed off a quarter turn to place it in sliding mode. Toggling the screw driver handle on the lag screw confirmed engagement.  The distal screw was placed through a stab incision using a sharp trocar and cannula. This was drilled with appropriate drill bit and measured off the drill bit to 32.5 mm this was placed and confirmed by x-ray  Our hardware position and reduction were confirmed by x-ray.  Wounds were thoroughly irrigated  Proximal wound was closed with 2 layers of 0 Monocryl  Second and third wounds were closed with 0 Monocryl as well. Staples were used to reapproximate the skin edges  The 30 mL of Marcaine was injected in the proximal wound in the subfascial plane prior to closure  Sterile dressing was applied  The limbs were taken out of traction and off the well leg holder and checked for rotation and found to be equal with both legs showing slight internal rotation.  The Foley catheter was leaking and a new Foley catheter was placed  Postop plan  Weightbearing as tolerated Staples out postop day 12-14 First x-ray can be taken postop day #28 First follow-up visit can be done on postop day #28 Anticoagulation aspirin once a day for 28 days    PROCEDURE:  Procedure(s): INTRAMEDULLARY (IM) NAIL INTERTROCHANTRIC (Left)  Gamma nail 125 degree short 95 lag screw  32.5 distal bolt Acorn in slide mode    SURGEON:  Surgeon(s) and Role:    * Vickki Hearing, MD - Primary  Assistants none  Anesthesia spinal  Blood administered none  Complications none  Marcaine with epinephrine 30 mL injected subfascial   EBL:  Total I/O In: 1400 [I.V.:1400] Out: 750 [Urine:700; Blood:50]  There were no drains    no  specimens  Counts correct  Dragon dictation   TOURNIQUET:  * No tourniquets in log *   PLAN OF CARE: Admit to inpatient   PATIENT DISPOSITION:  PACU - hemodynamically stable.   Delay start of Pharmacological VTE agent (>24hrs) due to surgical blood loss or risk of bleeding: yes

## 2016-01-07 NOTE — Anesthesia Postprocedure Evaluation (Signed)
Anesthesia Post Note  Patient: Amy Drake  Procedure(s) Performed: Procedure(s) (LRB): INTRAMEDULLARY (IM) NAIL INTERTROCHANTRIC (Left)  Patient location during evaluation: PACU Anesthesia Type: Spinal Level of consciousness: awake and confused (Patient at baseline with mental status) Pain management: pain level controlled Vital Signs Assessment: post-procedure vital signs reviewed and stable Respiratory status: spontaneous breathing, respiratory function stable and patient connected to face mask oxygen Cardiovascular status: stable Postop Assessment: no signs of nausea or vomiting and spinal receding Anesthetic complications: no    Last Vitals:  Filed Vitals:   01/07/16 0415 01/07/16 1101  BP: 133/85 147/93  Pulse: 83   Temp: 36.7 C 36.7 C  Resp: 18     Last Pain:  Filed Vitals:   01/07/16 1138  PainSc: Asleep                 Jahiem Franzoni A

## 2016-01-07 NOTE — Transfer of Care (Signed)
Immediate Anesthesia Transfer of Care Note  Patient: Amy Drake  Procedure(s) Performed: Procedure(s): INTRAMEDULLARY (IM) NAIL INTERTROCHANTRIC (Left)  Patient Location: PACU  Anesthesia Type:Spinal  Level of Consciousness: sedated  Airway & Oxygen Therapy: Patient Spontanous Breathing and Patient connected to face mask oxygen  Post-op Assessment: Report given to RN and Post -op Vital signs reviewed and stable  Post vital signs: Reviewed and stable  Last Vitals:  Filed Vitals:   01/07/16 0215 01/07/16 0415  BP: 138/86 133/85  Pulse: 85 83  Temp: 36.9 C 36.7 C  Resp: 16 18    Complications: No apparent anesthesia complications

## 2016-01-07 NOTE — Progress Notes (Signed)
[facility] PROGRESS NOTE  Cameren Odwyer Siegert ZOX:[id number] DOB: 1946-03-22 DOA: 01/06/2016 PCP: [name], MD  Assessment/Plan: 1. Left Hip Fracture, Femoral Neck: patient when to operating room this morning, holding DVT prophylaxis until 24 hours post op then will begin aspirin.  Weightbearing as tolerated. Patient to follow up with orthopaedic surgery at 4 weeks post op.  2. Dementia: baseline mental status 3. Hypertension: continue norvasc 2.5mg  daily 4. Diet: patient to be assisted at meals with nursing staff  Code Status: full Family Communication: patient cousin bedside Disposition Plan: discharge tomorrow if medically stable   Consultants:  Orthopaedic surgery  Procedures:  Intramedullary Nail Intertrochantric (left)  Antibiotics:  Ancef 1 dose pre- op  HPI/Subjective: 70 year old female with history of dementia, HTN and right hip arthroplasty admitted for left femoral neck fracture. Patient is a resident of a skilled nursing facility and had a witnessed fall . Fall was described as a trip over another resident. Patient is mostly nonverbal (she will utter words but not in response to questions and at most times incomprehensibly). Seen and evaluated post op.  Patient awake and oriented to self but otherwise speech is slurred and mumbled.  Does not appear to be in pain.  Objective: Filed Vitals:   01/07/16 1149 01/07/16 1159  BP: 152/94 149/99  Pulse: 78   Temp:  98.1 F (36.7 C)  Resp:  18    Intake/Output Summary (Last 24 hours) at 01/07/16 1241 Last data filed at 01/07/16 1120  Gross per 24 hour  Intake   2209 ml  Output   1050 ml  Net   1159 ml   Filed Weights   01/06/16 0944 01/06/16 1415  Weight: 58.968 kg (130 lb) 46.7 kg (102 lb 15.3 oz)    Exam:   General:  No acute distress, resting comfortably   Cardiovascular: S1S2 RRR no murmurs or gallops  Respiratory: clear to auscultation bilaterally  Abdomen: soft, nontender, nondistended, bowel  sounds in all quadrants  Musculoskeletal: no pain on palpation of left hip patient not able to follow instructions but moving arms and legs independently   Data Reviewed: Basic Metabolic Panel:  Recent Labs Lab 01/06/16 1116  NA 142  K 4.7  CL 108  CO2 28  GLUCOSE 133*  BUN 18  CREATININE 0.87  CALCIUM 9.5   Liver Function Tests: No results for input(s): AST, ALT, ALKPHOS, BILITOT, PROT, ALBUMIN in the last 168 hours. No results for input(s): LIPASE, AMYLASE in the last 168 hours. No results for input(s): AMMONIA in the last 168 hours. CBC:  Recent Labs Lab 01/06/16 1116  WBC 11.8*  NEUTROABS 10.7*  HGB 13.8  HCT 42.7  MCV 89.3  PLT 226   Cardiac Enzymes: No results for input(s): CKTOTAL, CKMB, CKMBINDEX, TROPONINI in the last 168 hours. BNP (last 3 results) No results for input(s): BNP in the last 8760 hours.  ProBNP (last 3 results) No results for input(s): PROBNP in the last 8760 hours.  CBG: No results for input(s): GLUCAP in the last 168 hours.  Recent Results (from the past 240 hour(s))  Surgical pcr screen     Status: None   Collection Time: 01/07/16  4:24 AM  Result Value Ref Range Status   MRSA, PCR NEGATIVE NEGATIVE Final   Staphylococcus aureus NEGATIVE NEGATIVE Final    Comment:        The Xpert SA Assay (FDA approved for NASAL specimens in patients over 50 years of age), is one component of  a comprehensive surveillance program.  Test performance has been validated by [facility] for patients greater than or equal to 16 year old. It is not intended to diagnose infection nor to guide or monitor treatment.      Studies: Chest Portable 1 View  01/06/2016  CLINICAL DATA:  Fall, dementia EXAM: PORTABLE CHEST 1 VIEW COMPARISON:  Radiograph 02/15/2012 FINDINGS: Normal cardiac silhouette. Fine interstitial pattern not changed from prior. No effusion, infiltrate or pneumothorax. IMPRESSION: Fine interstitial pattern suggests chronic interstitial  lung disease. No significant change from prior. No acute findings. Electronically Signed   By: Genevive Bi M.D.   On: 01/06/2016 14:49   Dg Hip Operative Unilat With Pelvis Right  01/07/2016  CLINICAL DATA:  70 year old female undergoing left proximal femur ORIF. Acute left femoral distal neck/intertrochanteric fracture. Initial encounter. EXAM: OPERATIVE LEFT HIP (WITH PELVIS IF PERFORMED) 11 VIEWS TECHNIQUE: Fluoroscopic spot image(s) were submitted for interpretation post-operatively. COMPARISON:  01/06/2016 and earlier FINDINGS: Eleven intraoperative fluoroscopic spot views of the proximal left femur. These images demonstrate left femur intra medullary rod placement with interlocking proximal dynamic hip screw, and distal interlocking cortical screw. Hardware appears intact. Alignment about the fracture site improved. IMPRESSION: ORIF proximal left femur with no adverse features. Electronically Signed   By: [name] M.D.   On: 01/07/2016 11:23   Dg Hips Bilat With Pelvis 3-4 Views  01/06/2016  CLINICAL DATA:  Left hip pain post fall EXAM: DG HIP (WITH OR WITHOUT PELVIS) 3-4V BILAT COMPARISON:  None. FINDINGS: Five views bilateral hip submitted. There is right hip prosthesis with anatomic alignment. No evidence of prosthesis loosening. Displaced fracture of the left femoral neck is noted. IMPRESSION: Displaced fracture of the left femoral neck. Right hip prosthesis with anatomic alignment. Electronically Signed   By: [name] M.D.   On: 01/06/2016 12:00    Scheduled Meds: . acetaminophen  1,000 mg Intravenous 4 times per day  . amLODipine  2.5 mg Oral Daily  . [START ON 01/08/2016] aspirin EC  325 mg Oral Q breakfast  .  ceFAZolin (ANCEF) IV  2 g Intravenous Q6H  . docusate sodium  100 mg Oral BID  . latanoprost  1 drop Both Eyes QHS  . traMADol  50 mg Oral 4 times per day   Continuous Infusions: . sodium chloride 75 mL/hr at 01/07/16 1207    Principal Problem:   Hip fracture, left  (HCC) Active Problems:   Dementia   HTN (hypertension)   Femoral neck fracture, left, closed, initial encounter    Time spent:  25 minutes    Reuben Likes  Resident Physician  [facility] Pager [phone] If 7PM-7AM, please contact night-coverage at [email or web address], password [facility] 01/07/2016, 12:41 PM  LOS: 1 day

## 2016-01-07 NOTE — Anesthesia Procedure Notes (Addendum)
Date/Time: 01/07/2016 8:52 AM Performed by: Pernell Dupre, Karsten Vaughn A Pre-anesthesia Checklist: Patient identified, Emergency Drugs available, Timeout performed, Suction available and Patient being monitored Oxygen Delivery Method: Simple face mask   Spinal Patient location during procedure: OR Start time: 01/07/2016 9:15 AM Staffing Resident/CRNA: Twanna Resh A Preanesthetic Checklist Completed: patient identified, site marked, surgical consent, pre-op evaluation, timeout performed, IV checked, risks and benefits discussed and monitors and equipment checked Spinal Block Patient position: left lateral decubitus Prep: Betadine Patient monitoring: heart rate, cardiac monitor, continuous pulse ox and blood pressure Approach: left paramedian Location: L3-4 Injection technique: single-shot Needle Needle type: Spinocan  Needle gauge: 22 G Needle length: 9 cm Assessment Sensory level: T8 Additional Notes  ATTEMPTS:1 TRAY MV:[id number] TRAY EXPIRATION DATE:04-01-17

## 2016-01-08 DIAGNOSIS — E46 Unspecified protein-calorie malnutrition: Secondary | ICD-10-CM | POA: Insufficient documentation

## 2016-01-08 LAB — BASIC METABOLIC PANEL
ANION GAP: 9 (ref 5–15)
BUN: 8 mg/dL (ref 6–20)
CALCIUM: 8.6 mg/dL — AB (ref 8.9–10.3)
CO2: 24 mmol/L (ref 22–32)
Chloride: 104 mmol/L (ref 101–111)
Creatinine, Ser: 0.74 mg/dL (ref 0.44–1.00)
GFR calc Af Amer: >60 mL/min (ref >60)
Glucose, Bld: 113 mg/dL — ABNORMAL HIGH (ref 65–99)
POTASSIUM: 3.6 mmol/L (ref 3.5–5.1)
SODIUM: 137 mmol/L (ref 135–145)

## 2016-01-08 LAB — CBC
HCT: 36.9 % (ref 36.0–46.0)
Hemoglobin: 12.5 g/dL (ref 12.0–15.0)
MCH: 29.6 pg (ref 26.0–34.0)
MCHC: 33.9 g/dL (ref 30.0–36.0)
MCV: 87.2 fL (ref 78.0–100.0)
PLATELETS: 192 10*3/uL (ref 150–400)
RBC: 4.23 MIL/uL (ref 3.87–5.11)
RDW: 13 % (ref 11.5–15.5)
WBC: 14.4 10*3/uL — AB (ref 4.0–10.5)

## 2016-01-08 NOTE — Progress Notes (Signed)
[facility] PROGRESS NOTE  Amy Drake ZOX:[id number] DOB: [birth year] DOA: 01/06/2016 PCP: [name], MD  Assessment/Plan: 1. Left Hip Fracture, Femoral Neck: patient when to operating room this morning, holding DVT prophylaxis until 24 hours post op then will begin aspirin (will continue for 28 days).  Weightbearing as tolerated. Patient to follow up with orthopaedic surgery at 4 weeks post op. Pain management with morphine and tramadol (tramadol is scheduled and morphine PRN); unclear of discharge disposition as patient may require more care than available at previous skilled nursing facility 2. Dementia: baseline mental status 3. Hypertension: continue norvasc 2.5mg  daily 4. Diet: patient to be assisted at meals with nursing staff  Code Status: full Family Communication: no family bedside Disposition Plan: discharge once seen and evaluated by PT/OT; likely discharge tomorrow   Consultants:  Orthopaedic surgery  Procedures:  Intramedullary Nail Intertrochantric (left) 01/07/16  Antibiotics:  Ancef 1 dose pre- op  HPI/Subjective: 70 year old female with history of dementia, HTN and right hip arthroplasty admitted for left femoral neck fracture. Patient is a resident of a skilled nursing facility and had a witnessed fall . Fall was described as a trip over another resident. Patient is mostly nonverbal (she will utter words but not in response to questions and at most times incomprehensibly). Seen and evaluated post op.  Patient awake and oriented to self but otherwise speech is slurred and mumbled.  Does not appear to be in pain and appears tired.  Last tramadol dose at almost 6am this morning.  Patient's cousin voiced yesterday she is unsure if patient will need to go to another facility besides placed she came from for increased care.  Objective: Filed Vitals:   01/08/16 0010 01/08/16 0410  BP: 169/83 137/78  Pulse: 95 79  Temp: 98.4 F (36.9 C) 98.3 F (36.8 C)  Resp:  20 20    Intake/Output Summary (Last 24 hours) at 01/08/16 0950 Last data filed at 01/08/16 0559  Gross per 24 hour  Intake 1821.25 ml  Output   1750 ml  Net  71.25 ml   Filed Weights   01/06/16 0944 01/06/16 1415  Weight: 58.968 kg (130 lb) 46.7 kg (102 lb 15.3 oz)    Exam:   General:  No acute distress, resting comfortably   Cardiovascular: S1S2 RRR no murmurs or gallops  Respiratory: clear to auscultation bilaterally  Abdomen: soft, nontender, nondistended, bowel sounds in all quadrants  Musculoskeletal: no pain on palpation of left hip patient not able to follow instructions but moving arms and legs independently   Data Reviewed: Basic Metabolic Panel:  Recent Labs Lab 01/06/16 1116 01/08/16 0629  NA 142 137  K 4.7 3.6  CL 108 104  CO2 28 24  GLUCOSE 133* 113*  BUN 18 8  CREATININE 0.87 0.74  CALCIUM 9.5 8.6*   Liver Function Tests: No results for input(s): AST, ALT, ALKPHOS, BILITOT, PROT, ALBUMIN in the last 168 hours. No results for input(s): LIPASE, AMYLASE in the last 168 hours. No results for input(s): AMMONIA in the last 168 hours. CBC:  Recent Labs Lab 01/06/16 1116 01/08/16 0629  WBC 11.8* 14.4*  NEUTROABS 10.7*  --   HGB 13.8 12.5  HCT 42.7 36.9  MCV 89.3 87.2  PLT 226 192   Cardiac Enzymes: No results for input(s): CKTOTAL, CKMB, CKMBINDEX, TROPONINI in the last 168 hours. BNP (last 3 results) No results for input(s): BNP in the last 8760 hours.  ProBNP (last 3 results) No results for  input(s): PROBNP in the last 8760 hours.  CBG: No results for input(s): GLUCAP in the last 168 hours.  Recent Results (from the past 240 hour(s))  Surgical pcr screen     Status: None   Collection Time: 01/07/16  4:24 AM  Result Value Ref Range Status   MRSA, PCR NEGATIVE NEGATIVE Final   Staphylococcus aureus NEGATIVE NEGATIVE Final    Comment:        The Xpert SA Assay (FDA approved for NASAL specimens in patients over 21 years of  age), is one component of a comprehensive surveillance program.  Test performance has been validated by [facility] for patients greater than or equal to 74 year old. It is not intended to diagnose infection nor to guide or monitor treatment.      Studies: Chest Portable 1 View  01/06/2016  CLINICAL DATA:  Fall, dementia EXAM: PORTABLE CHEST 1 VIEW COMPARISON:  Radiograph 02/15/2012 FINDINGS: Normal cardiac silhouette. Fine interstitial pattern not changed from prior. No effusion, infiltrate or pneumothorax. IMPRESSION: Fine interstitial pattern suggests chronic interstitial lung disease. No significant change from prior. No acute findings. Electronically Signed   By: Genevive Bi M.D.   On: 01/06/2016 14:49   Dg Hip Operative Unilat With Pelvis Right  01/07/2016  CLINICAL DATA:  70 year old female undergoing left proximal femur ORIF. Acute left femoral distal neck/intertrochanteric fracture. Initial encounter. EXAM: OPERATIVE LEFT HIP (WITH PELVIS IF PERFORMED) 11 VIEWS TECHNIQUE: Fluoroscopic spot image(s) were submitted for interpretation post-operatively. COMPARISON:  01/06/2016 and earlier FINDINGS: Eleven intraoperative fluoroscopic spot views of the proximal left femur. These images demonstrate left femur intra medullary rod placement with interlocking proximal dynamic hip screw, and distal interlocking cortical screw. Hardware appears intact. Alignment about the fracture site improved. IMPRESSION: ORIF proximal left femur with no adverse features. Electronically Signed   By: [name] M.D.   On: 01/07/2016 11:23   Dg Hips Bilat With Pelvis 3-4 Views  01/06/2016  CLINICAL DATA:  Left hip pain post fall EXAM: DG HIP (WITH OR WITHOUT PELVIS) 3-4V BILAT COMPARISON:  None. FINDINGS: Five views bilateral hip submitted. There is right hip prosthesis with anatomic alignment. No evidence of prosthesis loosening. Displaced fracture of the left femoral neck is noted. IMPRESSION: Displaced fracture of  the left femoral neck. Right hip prosthesis with anatomic alignment. Electronically Signed   By: [name] M.D.   On: 01/06/2016 12:00    Scheduled Meds: . amLODipine  2.5 mg Oral Daily  . aspirin EC  325 mg Oral Q breakfast  . docusate sodium  100 mg Oral BID  . latanoprost  1 drop Both Eyes QHS  . traMADol  50 mg Oral 4 times per day   Continuous Infusions: . sodium chloride 75 mL/hr at 01/08/16 1096    Principal Problem:   Hip fracture, left (HCC) Active Problems:   Dementia   HTN (hypertension)   Femoral neck fracture, left, closed, initial encounter   Closed left hip fracture [facility])   Fall    Time spent:  25 minutes    Reuben Likes  Resident Physician  [facility] Pager [phone] If 7PM-7AM, please contact night-coverage at [email or web address], password [facility] 01/08/2016, 9:50 AM  LOS: 2 days

## 2016-01-08 NOTE — Anesthesia Postprocedure Evaluation (Signed)
Anesthesia Post Note  Patient: Amy Drake  Procedure(s) Performed: Procedure(s) (LRB): INTRAMEDULLARY (IM) NAIL INTERTROCHANTRIC (Left)  Patient location during evaluation: Nursing Unit Anesthesia Type: Spinal Level of consciousness: awake and alert Pain management: pain level controlled Vital Signs Assessment: post-procedure vital signs reviewed and stable Respiratory status: spontaneous breathing Cardiovascular status: stable Postop Assessment: no backache Anesthetic complications: no    Last Vitals:  Filed Vitals:   01/08/16 0010 01/08/16 0410  BP: 169/83 137/78  Pulse: 95 79  Temp: 36.9 C 36.8 C  Resp: 20 20    Last Pain:  Filed Vitals:   01/08/16 0801  PainSc: Asleep                 [name], AMY A

## 2016-01-08 NOTE — Addendum Note (Signed)
Addendum  created 01/08/16 1320 by Earleen Newport, CRNA   Modules edited: Clinical Notes   Clinical Notes:  File: [id number]

## 2016-01-08 NOTE — Evaluation (Signed)
Physical Therapy Evaluation Patient Details Name: Amy Drake MRN: [id number] DOB: [birth year] Today's Date: 01/08/2016   History of Present Illness  70 year old female with history of dementia, HTN and right hip arthroplasty admitted for left femoral neck fracture. Patient is a resident of a skilled nursing facility and had a witnessed fall . Fall was described as a trip over another resident. Patient is mostly nonverbal (she will utter words but not in response to questions and at most times incomprehensibly  Clinical Impression  Pt unable to follow commands.  Therapist completed PROM.  Attempted bed mobility but pt resisted therapist therefore unable to continue.     Follow Up Recommendations SNF    Equipment Recommendations  None recommended by PT    Recommendations for Other Services   none    Precautions / Restrictions Precautions Precautions: Fall Restrictions Weight Bearing Restrictions: Yes LLE Weight Bearing: Weight bearing as tolerated      Mobility  Bed Mobility               General bed mobility comments: attempted but pt became anxious and began pushing back to return to supine postion saying,"don't, don't,don't)  Transfers                    Ambulation/Gait                Stairs            Wheelchair Mobility    Modified Rankin (Stroke Patients Only)       Balance                                             Pertinent Vitals/Pain Pain Assessment: Faces Pain Score: 2  Pain Location: Pt rubs Lt hip but unable to communicate Pain Intervention(s): Repositioned    Home Living Family/patient expects to be discharged to:: Skilled nursing facility                      Prior Function Level of Independence: Needs assistance   Gait / Transfers Assistance Needed: max   ADL's / Homemaking Assistance Needed: total        Hand Dominance        Extremity/Trunk Assessment                Lower Extremity Assessment: Difficult to assess due to impaired cognition         Communication   Communication: Receptive difficulties;Expressive difficulties  Cognition Arousal/Alertness: Awake/alert Behavior During Therapy: Anxious Overall Cognitive Status: History of cognitive impairments - at baseline                         Exercises General Exercises - Lower Extremity Ankle Circles/Pumps: PROM;Both;10 reps Heel Slides: AAROM;Both;5 reps Hip ABduction/ADduction: Both;5 reps;PROM      Assessment/Plan    PT Assessment Patient needs continued PT services  PT Diagnosis Generalized weakness;Difficulty walking   PT Problem List Decreased strength;Decreased activity tolerance;Decreased mobility;Pain;Decreased safety awareness  PT Treatment Interventions DME instruction;Gait training;Therapeutic exercise   PT Goals (Current goals can be found in the Care Plan section) Acute Rehab PT Goals PT Goal Formulation: Patient unable to participate in goal setting Time For Goal Achievement: 01/11/16 Potential to Achieve Goals: Fair    Frequency Min 4X/week   Barriers to  discharge        Co-evaluation               End of Session   Activity Tolerance: Treatment limited secondary to medical complications (Comment) Patient left: in bed;with bed alarm set;with call bell/phone within reach           Time: 1610-9604 PT Time Calculation (min) (ACUTE ONLY): 40 min   Charges:   PT Evaluation $PT Eval Moderate Complexity: 1 Procedure     PT G CodesVirgina Organ, PT CLT [phone] 01/08/2016, 11:16 AM

## 2016-01-09 ENCOUNTER — Encounter (HOSPITAL_COMMUNITY): Payer: Self-pay | Admitting: Orthopedic Surgery

## 2016-01-09 DIAGNOSIS — I10 Essential (primary) hypertension: Secondary | ICD-10-CM

## 2016-01-09 DIAGNOSIS — F0390 Unspecified dementia without behavioral disturbance: Secondary | ICD-10-CM

## 2016-01-09 DIAGNOSIS — E46 Unspecified protein-calorie malnutrition: Secondary | ICD-10-CM

## 2016-01-09 DIAGNOSIS — S72002D Fracture of unspecified part of neck of left femur, subsequent encounter for closed fracture with routine healing: Secondary | ICD-10-CM

## 2016-01-09 LAB — BASIC METABOLIC PANEL
Anion gap: 8 (ref 5–15)
BUN: 11 mg/dL (ref 6–20)
CHLORIDE: 102 mmol/L (ref 101–111)
CO2: 25 mmol/L (ref 22–32)
Calcium: 8.4 mg/dL — ABNORMAL LOW (ref 8.9–10.3)
Creatinine, Ser: 0.68 mg/dL (ref 0.44–1.00)
GFR calc Af Amer: >60 mL/min (ref >60)
GFR calc non Af Amer: >60 mL/min (ref >60)
Glucose, Bld: 122 mg/dL — ABNORMAL HIGH (ref 65–99)
POTASSIUM: 3.5 mmol/L (ref 3.5–5.1)
SODIUM: 135 mmol/L (ref 135–145)

## 2016-01-09 LAB — CBC
HEMATOCRIT: 36.2 % (ref 36.0–46.0)
HEMOGLOBIN: 12.3 g/dL (ref 12.0–15.0)
MCH: 29.6 pg (ref 26.0–34.0)
MCHC: 34 g/dL (ref 30.0–36.0)
MCV: 87 fL (ref 78.0–100.0)
Platelets: 191 10*3/uL (ref 150–400)
RBC: 4.16 MIL/uL (ref 3.87–5.11)
RDW: 12.9 % (ref 11.5–15.5)
WBC: 14 10*3/uL — ABNORMAL HIGH (ref 4.0–10.5)

## 2016-01-09 MED ORDER — ASPIRIN 325 MG PO TBEC: 325.0000 mg | DELAYED_RELEASE_TABLET | Freq: Every day | ORAL | Status: AC

## 2016-01-09 MED ORDER — TRAMADOL HCL 50 MG PO TABS: 50.0000 mg | ORAL_TABLET | Freq: Four times a day (QID) | ORAL | Status: AC | PRN

## 2016-01-09 NOTE — Evaluation (Signed)
Occupational Therapy Evaluation Patient Details Name: Amy Drake MRN: [id number] DOB: January 27, 1946 Today's Date: 01/09/2016    History of Present Illness 70 year old female with history of dementia, HTN and right hip arthroplasty admitted for left femoral neck fracture. Patient is a resident of a skilled nursing facility and had a witnessed fall . Fall was described as a trip over another resident. Patient is mostly nonverbal (she will utter words but not in response to questions and at most times incomprehensibly   Clinical Impression   Patient in bed upon therapy arrival. Patient unable to respond to any questions. Patient did complete sit to stand at EOB with increased time and max verbal and visual cueing. Pt with fascial grimacing with any movement or touch to Left hip. Due to increased dementia patient will require total care and/cueing to complete basic daily tasks. Recommend return to SNF with staff providing care for all daily tasks. Pt left in bed with ice to hip. No bed alarm boxes available to place patient in chair.      Follow Up Recommendations  No OT follow up    Equipment Recommendations  None recommended by OT    Recommendations for Other Services       Precautions / Restrictions Precautions Precautions: Fall Restrictions LLE Weight Bearing: Weight bearing as tolerated      Mobility Bed Mobility Overal bed mobility: Needs Assistance Bed Mobility: Sit to Supine;Supine to Sit     Supine to sit: Total assist (Due to cognition) Sit to supine:  (Due to cognition)      Transfers Overall transfer level: Needs assistance   Transfers: Sit to/from Stand Sit to Stand: Min assist         General transfer comment: Pt was able to complete sit to stand with increased time and max cueing.          ADL Overall ADL's : Needs assistance/impaired Eating/Feeding: Total assistance;Bed level Eating/Feeding Details (indicate cue type and reason): Due to cognitive  deficits.                 Lower Body Dressing: Total assistance;Bed level                 General ADL Comments: Unable to complete transfer as there were no chair alarm boxes available for use.                Pertinent Vitals/Pain Pain Assessment: Faces Faces Pain Scale: Hurts a little bit Pain Location: Left hip. fascial grimancing with movement.      Hand Dominance     Extremity/Trunk Assessment Upper Extremity Assessment Upper Extremity Assessment: Overall WFL for tasks assessed           Communication     Cognition Arousal/Alertness: Lethargic Behavior During Therapy: Flat affect Overall Cognitive Status: History of cognitive impairments - at baseline                                Home Living Family/patient expects to be discharged to:: Skilled nursing facility                                        Prior Functioning/Environment Level of Independence: Needs assistance      Communication / Swallowing Assistance Needed: Pt unable to communicate due to dementia  Comments: No  background information available in medical chart. patient is unable to answer questions due to dementia. Based on patient's cognitive deficits she will require assitance with ADL tasks.                               End of Session Equipment Utilized During Treatment: Gait belt  Activity Tolerance: Patient limited by pain;Other (comment) (Limited due to cognition) Patient left: in bed;with bed alarm set   Time: [id number] OT Time Calculation (min): 30 min Charges:  OT General Charges $OT Visit: 1 Procedure OT Evaluation $OT Eval Low Complexity: 1 Procedure G-Codes:    Limmie Patricia, OTR/L,CBIS  [phone]  01/09/2016, 9:16 AM

## 2016-01-09 NOTE — Discharge Summary (Signed)
Physician Discharge Summary  Amy Drake RUE:[id number] DOB: January 04, 1946 DOA: 01/06/2016  PCP: [name], MD  Admit date: 01/06/2016 Discharge date: 01/09/2016  Recommendations for Outpatient Follow-up:  1. Patient to weightbear as tolerated 2. Staples out post op day 12-14 3. First xray taken postop day 28 4. First follow up visit with Dr. [name] post op day 28 5. Aspirin daily for 28 days  6. Nursing staff to assist patient with eating Follow-up Information    Follow up with [facility] .   Specialty:  Skilled Nursing Facility   Contact information:   [address] [place] [address] [phone]     Discharge Diagnoses:  1. Hip fracture, left 2. HTN 3. Dementia 4. Protein- calorie malnutrition  Discharge Condition: stable Disposition: skilled nursing facility  Diet recommendation: Carb modified heart healthy diet  Filed Weights   01/06/16 0944 01/06/16 1415  Weight: 58.968 kg (130 lb) 46.7 kg (102 lb 15.3 oz)    History of present illness:  70 year old female with history of dementia, HTN and right hip arthroplasty admitted for left femoral neck fracture. Patient is a resident of a skilled nursing facility and had a witnessed fall . Fall was described as a trip over another resident. Patient is mostly nonverbal (she will utter words but not in response to questions and at most times incomprehensibly).     Hospital Course:  Patient admitted and evaluated by orthopaedic surgery.  She was taken to surgery on 01/07/16 for intramedullary nail  Intratrochanteric.  She tolerated procedure well.  PT and OT saw patient and evaluated her.  She was able to be weightbearing as tolerated.  They recommend patient go to SNF for total assistance and PT/OT.  Patient was stable for discharge on 01/09/16.   Discharge Instructions  Discharge Instructions    Call MD for:  redness, tenderness, or signs of infection (pain, swelling, redness, odor or  green/yellow discharge around incision site)    Complete by:  As directed      Diet - low sodium heart healthy    Complete by:  As directed      Diet general    Complete by:  As directed      Increase activity slowly    Complete by:  As directed      Increase activity slowly    Complete by:  As directed           Current Discharge Medication List    START taking these medications   Details  aspirin EC 325 MG EC tablet Take 1 tablet (325 mg total) by mouth daily with breakfast. Qty: 30 tablet, Refills: 0    traMADol (ULTRAM) 50 MG tablet Take 1 tablet (50 mg total) by mouth every 6 (six) hours as needed for moderate pain. Qty: 30 tablet, Refills: 0      CONTINUE these medications which have NOT CHANGED   Details  acetaminophen (TYLENOL) 325 MG tablet Take 650 mg by mouth every 6 (six) hours as needed for mild pain.    amLODipine (NORVASC) 2.5 MG tablet Take 2.5 mg by mouth daily.    latanoprost (XALATAN) 0.005 % ophthalmic solution Place 1 drop into both eyes at bedtime.       No Known Allergies  The results of significant diagnostics from this hospitalization (including imaging, microbiology, ancillary and laboratory) are listed below for reference.    Significant Diagnostic Studies: Chest Portable 1 View  01/06/2016  CLINICAL DATA:  Fall, dementia EXAM: PORTABLE CHEST 1 VIEW COMPARISON:  Radiograph 02/15/2012 FINDINGS: Normal cardiac silhouette. Fine interstitial pattern not changed from prior. No effusion, infiltrate or pneumothorax. IMPRESSION: Fine interstitial pattern suggests chronic interstitial lung disease. No significant change from prior. No acute findings. Electronically Signed   By: Genevive Bi M.D.   On: 01/06/2016 14:49   Dg Hip Operative Unilat With Pelvis Right  01/07/2016  CLINICAL DATA:  70 year old female undergoing left proximal femur ORIF. Acute left femoral distal neck/intertrochanteric fracture. Initial encounter. EXAM: OPERATIVE LEFT HIP (WITH  PELVIS IF PERFORMED) 11 VIEWS TECHNIQUE: Fluoroscopic spot image(s) were submitted for interpretation post-operatively. COMPARISON:  01/06/2016 and earlier FINDINGS: Eleven intraoperative fluoroscopic spot views of the proximal left femur. These images demonstrate left femur intra medullary rod placement with interlocking proximal dynamic hip screw, and distal interlocking cortical screw. Hardware appears intact. Alignment about the fracture site improved. IMPRESSION: ORIF proximal left femur with no adverse features. Electronically Signed   By: [name] M.D.   On: 01/07/2016 11:23   Dg Hips Bilat With Pelvis 3-4 Views  01/06/2016  CLINICAL DATA:  Left hip pain post fall EXAM: DG HIP (WITH OR WITHOUT PELVIS) 3-4V BILAT COMPARISON:  None. FINDINGS: Five views bilateral hip submitted. There is right hip prosthesis with anatomic alignment. No evidence of prosthesis loosening. Displaced fracture of the left femoral neck is noted. IMPRESSION: Displaced fracture of the left femoral neck. Right hip prosthesis with anatomic alignment. Electronically Signed   By: [name] M.D.   On: 01/06/2016 12:00    Microbiology: Recent Results (from the past 240 hour(s))  Surgical pcr screen     Status: None   Collection Time: 01/07/16  4:24 AM  Result Value Ref Range Status   MRSA, PCR NEGATIVE NEGATIVE Final   Staphylococcus aureus NEGATIVE NEGATIVE Final    Comment:        The Xpert SA Assay (FDA approved for NASAL specimens in patients over 80 years of age), is one component of a comprehensive surveillance program.  Test performance has been validated by [facility] for patients greater than or equal to 51 year old. It is not intended to diagnose infection nor to guide or monitor treatment.      Labs: Basic Metabolic Panel:  Recent Labs Lab 01/06/16 1116 01/08/16 0629 01/09/16 0556  NA 142 137 135  K 4.7 3.6 3.5  CL 108 104 102  CO2 GLUCOSE 133* 113* 122*  BUN CREATININE  0.87 0.74 0.68  CALCIUM 9.5 8.6* 8.4*   Liver Function Tests: No results for input(s): AST, ALT, ALKPHOS, BILITOT, PROT, ALBUMIN in the last 168 hours. No results for input(s): LIPASE, AMYLASE in the last 168 hours. No results for input(s): AMMONIA in the last 168 hours. CBC:  Recent Labs Lab 01/06/16 1116 01/08/16 0629 01/09/16 0556  WBC 11.8* 14.4* 14.0*  NEUTROABS 10.7*  --   --   HGB 13.8 12.5 12.3  HCT 42.7 36.9 36.2  MCV 89.3 87.2 87.0  PLT 226 192 191   Cardiac Enzymes: No results for input(s): CKTOTAL, CKMB, CKMBINDEX, TROPONINI in the last 168 hours. BNP: BNP (last 3 results) No results for input(s): BNP in the last 8760 hours.  ProBNP (last 3 results) No results for input(s): PROBNP in the last 8760 hours.  CBG: No results for input(s): GLUCAP in the last 168 hours.  Principal Problem:   Hip fracture, left (HCC) Active Problems:   Dementia  HTN (hypertension)   Femoral neck fracture, left, closed, initial encounter   Closed left hip fracture (HCC)   Fall   Protein-calorie malnutrition [facility])   Time coordinating discharge: >30 minutes  Signed: Reuben Likes Resident Physician 01/09/2016, 3:47 PM   Attending note:  Patient with advanced dementia, admitted after a fall and suffering a left hip fracture. She was seen by Dr. [name] and underwent operative repair. Her post operative course has been unremarkable. She will be discharged to SNF for physical therapy. Mental status is currently at baseline. Follow up instructions with Dr. [name] as noted above.  MEMON,JEHANZEB

## 2016-01-09 NOTE — NC FL2 (Signed)
[place] MEDICAID FL2 LEVEL OF CARE SCREENING TOOL     IDENTIFICATION  Patient Name: Amy Drake Birthdate: 04-Apr-1946 Sex: female Admission Date (Current Location): 01/06/2016  [facility] and [place] Number:  [employer] and Address:  [facility],  [address] [place] [address]      Provider Number: [id number]  Attending Physician Name and Address:  Erick Blinks, MD  Relative Name and Phone Number:       Current Level of Care: Hospital Recommended Level of Care: Skilled Nursing Facility Prior Approval Number:    Date Approved/Denied:   PASRR Number:  [id number])  Discharge Plan: SNF    Current Diagnoses: Patient Active Problem List   Diagnosis Date Noted  . Protein-calorie malnutrition (HCC)   . Closed left hip fracture (HCC)   . Fall   . Hip fracture, left (HCC) 01/06/2016  . Femoral neck fracture, left, closed, initial encounter 01/06/2016  . Acute gastroenteritis 02/16/2012  . HCAP (healthcare-associated pneumonia) 02/16/2012  . UTI (lower urinary tract infection) 02/16/2012  . Dementia 02/16/2012  . Dehydration 02/16/2012  . HTN (hypertension) 02/16/2012    Orientation RESPIRATION BLADDER Height & Weight        O2 (2L) Continent Weight: 102 lb 15.3 oz (46.7 kg) Height:   (165.1 cm)  BEHAVIORAL SYMPTOMS/MOOD NEUROLOGICAL BOWEL NUTRITION STATUS      Continent Diet  AMBULATORY STATUS COMMUNICATION OF NEEDS Skin   Extensive Assist Does not communicate Surgical wounds (left hip)                       Personal Care Assistance Level of Assistance  Bathing, Dressing Bathing Assistance: Maximum assistance   Dressing Assistance: Maximum assistance     Functional Limitations Info  Sight, Hearing, Speech Sight Info: Adequate Hearing Info: Adequate Speech Info: Impaired    SPECIAL CARE FACTORS FREQUENCY  PT (By licensed PT)     PT Frequency:  (5x/week)              Contractures      Additional Factors  Info  Psychotropic     Psychotropic Info:  (Ultram)         Current Medications (01/09/2016):  This is the current hospital active medication list Current Facility-Administered Medications  Medication Dose Route Frequency Provider Last Rate Last Dose  . 0.9 %  sodium chloride infusion   Intravenous Continuous Vickki Hearing, MD 75 mL/hr at 01/09/16 [phone]    . acetaminophen (TYLENOL) tablet 650 mg  650 mg Oral Q6H PRN Vickki Hearing, MD       Or  . acetaminophen (TYLENOL) suppository 650 mg  650 mg Rectal Q6H PRN Vickki Hearing, MD      . amLODipine (NORVASC) tablet 2.5 mg  2.5 mg Oral Daily Reuben Likes, MD   2.5 mg at 01/08/16 1029  . aspirin EC tablet 325 mg  325 mg Oral Q breakfast Vickki Hearing, MD   325 mg at 01/08/16 1029  . docusate sodium (COLACE) capsule 100 mg  100 mg Oral BID Reuben Likes, MD   100 mg at 01/08/16 2229  . latanoprost (XALATAN) 0.005 % ophthalmic solution 1 drop  1 drop Both Eyes QHS Reuben Likes, MD   1 drop at 01/08/16 2229  . menthol-cetylpyridinium (CEPACOL) lozenge 3 mg  1 lozenge Oral PRN Vickki Hearing, MD       Or  . phenol (CHLORASEPTIC) mouth spray 1 spray  1  spray Mouth/Throat PRN Vickki Hearing, MD      . metoCLOPramide (REGLAN) tablet 5-10 mg  5-10 mg Oral Q8H PRN Vickki Hearing, MD       Or  . metoCLOPramide (REGLAN) injection 5-10 mg  5-10 mg Intravenous Q8H PRN Vickki Hearing, MD      . morphine 2 MG/ML injection 1 mg  1 mg Intravenous Q2H PRN Elliot Cousin, MD   1 mg at 01/07/16 1647  . traMADol (ULTRAM) tablet 50 mg  50 mg Oral 4 times per day Vickki Hearing, MD   50 mg at 01/09/16 5409     Discharge Medications: Please see discharge summary for a list of discharge medications.  Relevant Imaging Results:  Relevant Lab Results:   Additional Information    Kura Bethards, Juleen China, LCSW

## 2016-01-09 NOTE — Clinical Social Work Note (Signed)
Clinical Social Work Assessment  Patient Details  Name: Amy Drake MRN: [id number] Date of Birth: 1946/11/19  Date of referral:  01/09/16               Reason for consult:  Facility Placement                Permission sought to share information with:    Permission granted to share information::     Name::        Agency::     Relationship::     Contact Information:     Housing/Transportation Living arrangements for the past 2 months:  Assisted Living Facility Source of Information:  Guardian, Facility Patient Interpreter Needed:  None Criminal Activity/Legal Involvement Pertinent to Current Situation/Hospitalization:  No - Comment as needed Significant Relationships:  Other(Comment) Engineer, building services and DSS Guardian) Lives with:  Facility Resident Do you feel safe going back to the place where you live?  Yes Need for family participation in patient care:  Yes (Comment)  Care giving concerns: None identified    Office manager / plan:  CSW spoke with Tammy at [employer].  Tammy advised that patient's legal guardian is Hiram Gash from [facility] DSS.  She stated that Patient has been in the facility since 2012.  She advised that at baseline patient ambulates unassisted. She stated that patient requires assistance with bathing, dressing and toileting due ot her dementia. She advised that patient has a sister at [facility] and she felt it was would be a good idea for patient to go there for rehab prior to coming back to the facility.  CSW spoke with Hiram Gash at [facility] DSS.  She confirmed Tammy's statements. She advised that she would prefer patient to go to [facility] or [facility] for rehab.    Employment status:  Retired Health and safety inspector:  Medicare PT Recommendations:  Skilled Nursing Facility Information / Referral to community resources:     Patient/Family's Response to care:  Patient's guardian is agreeable to SNF for rehab.  Patient/Family's  Understanding of and Emotional Response to Diagnosis, Current Treatment, and Prognosis:  Guardian understands patient diagnosis, treatment and prognosis.   Emotional Assessment Appearance:  Appears stated age Attitude/Demeanor/Rapport:  Unable to Assess Affect (typically observed):  Unable to Assess Orientation:    Alcohol / Substance use:  Not Applicable Psych involvement (Current and /or in the community):  No (Comment)  Discharge Needs  Concerns to be addressed:  Discharge Planning Concerns Readmission within the last 30 days:  No Current discharge risk:  None Barriers to Discharge:  No Barriers Identified   Annice Needy, LCSW 01/09/2016, 12:39 PM [phone]

## 2016-01-11 LAB — TYPE AND SCREEN
ABO/RH(D): O POS
Antibody Screen: NEGATIVE
UNIT DIVISION: 0
UNIT DIVISION: 0
Unit division: 0
Unit division: 0

## 2016-10-03 DEATH — deceased

## 2017-04-10 IMAGING — DX DG HIP (WITH OR WITHOUT PELVIS) 3-4V BILAT
5 series · 5 of 5 positions shown · non-contrast
Comparison: None.

CLINICAL DATA: Left hip pain post fall

EXAM:
DG HIP (WITH OR WITHOUT PELVIS) 3-4V BILAT

[pelvis ap]
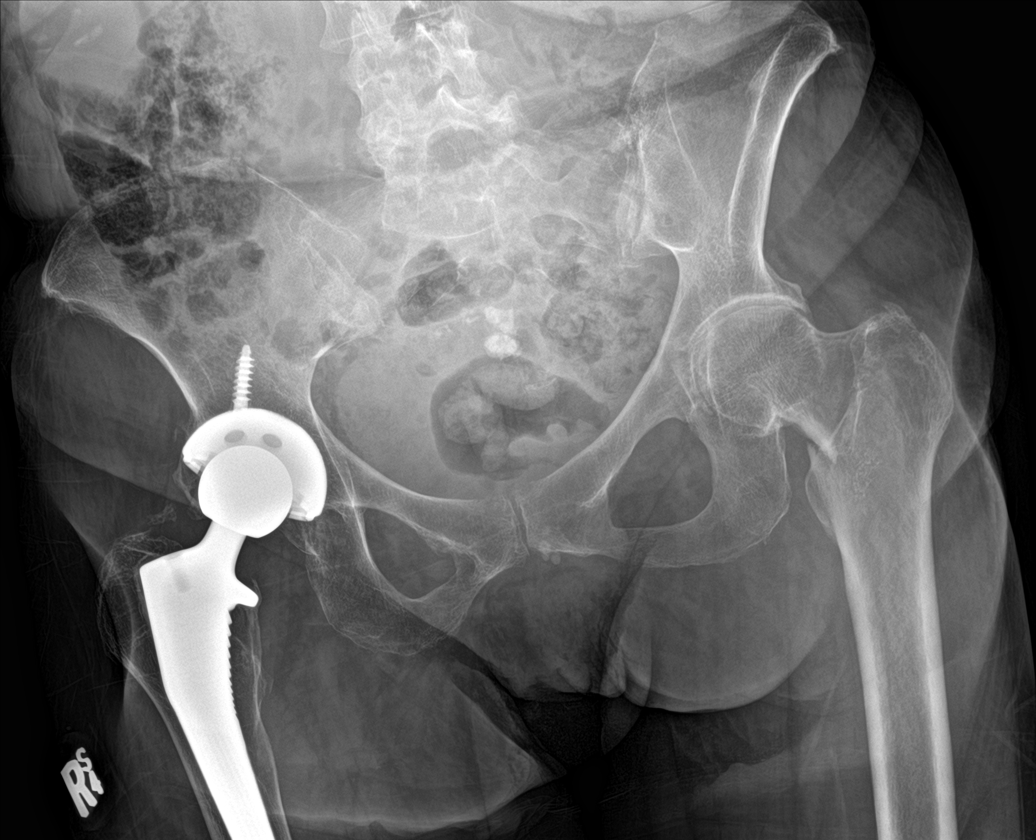

[hip ap (1 of 2)]
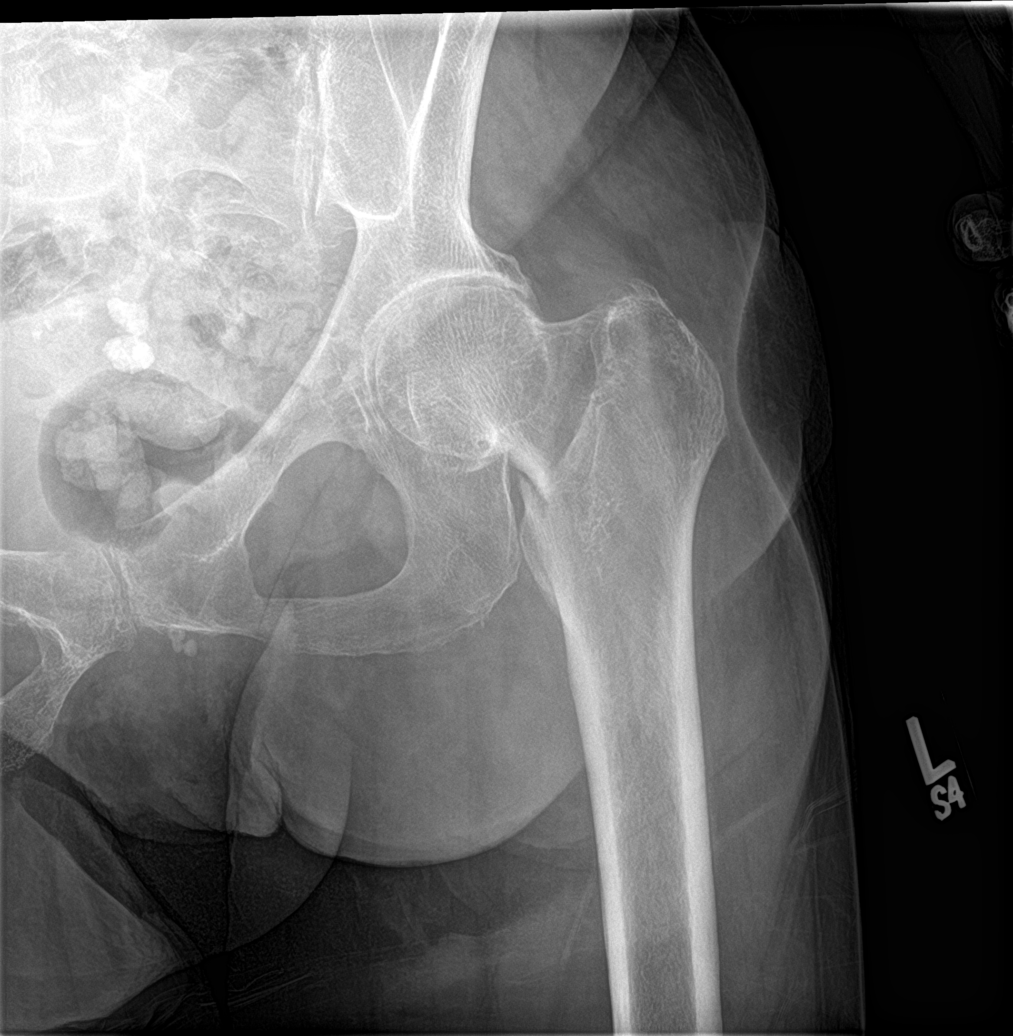

[hip ap (2 of 2)]
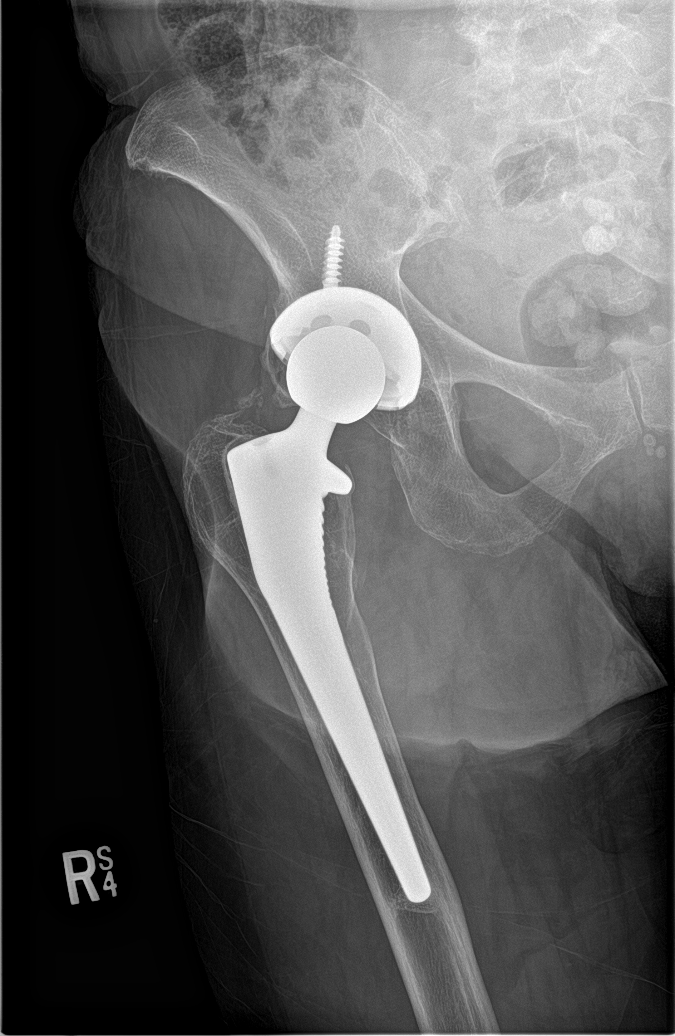

[hip frog leg (1 of 2)]
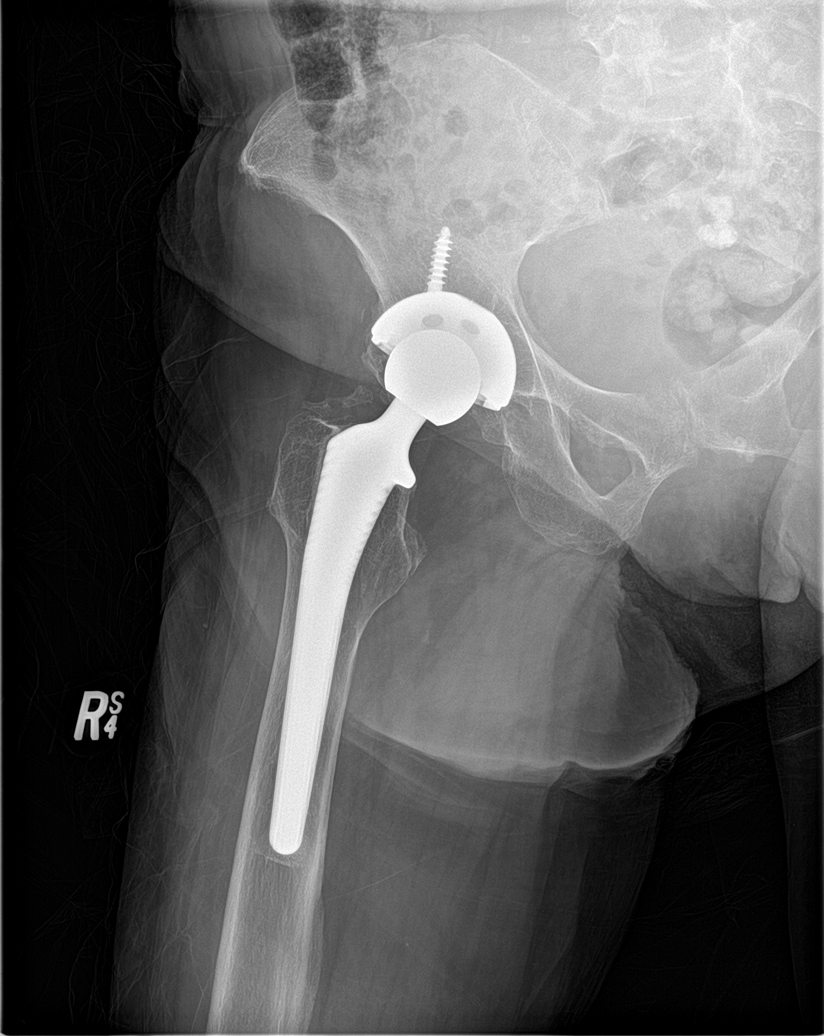

[hip frog leg (2 of 2)]
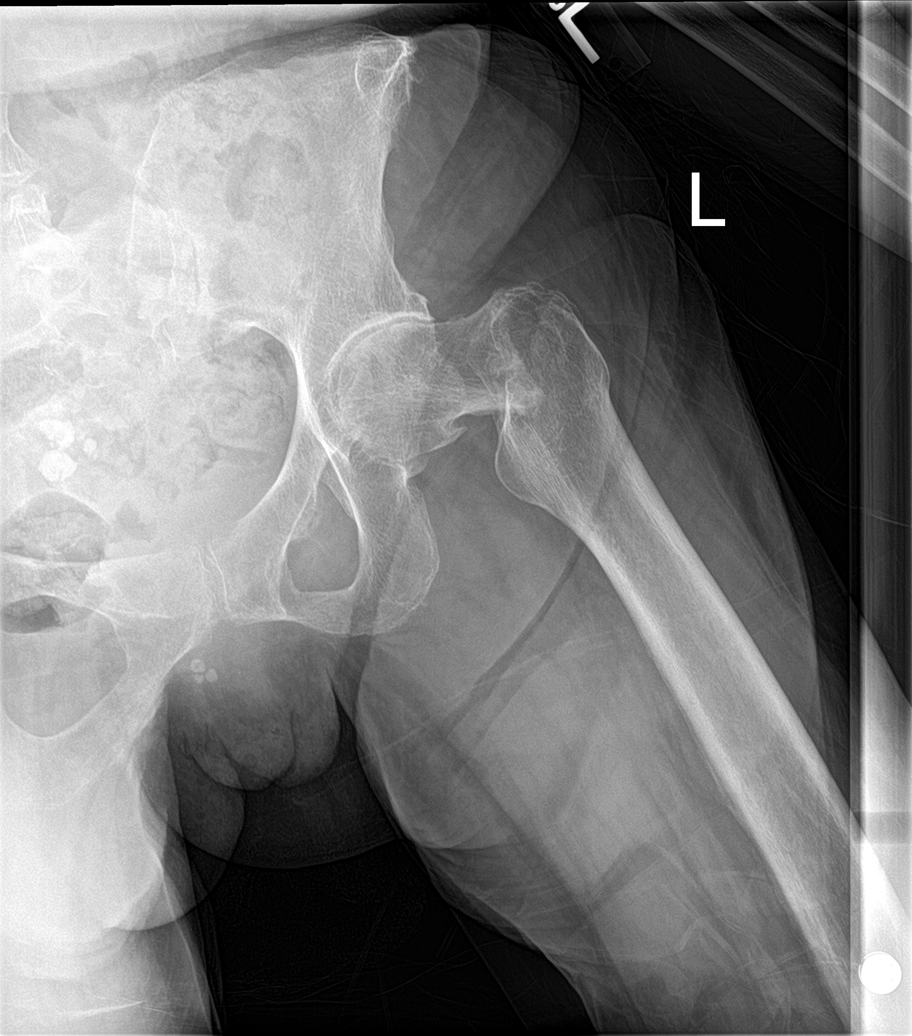

[5 of 5 positions shown; findings below may reference images not displayed]

FINDINGS: Five views bilateral hip submitted. There is right hip prosthesis
with anatomic alignment. No evidence of prosthesis loosening.
Displaced fracture of the left femoral neck is noted.
IMPRESSION: Displaced fracture of the left femoral neck. Right hip prosthesis
with anatomic alignment.
